# Patient Record
Sex: Female | Born: 1995 | Race: Black or African American | Hispanic: No | Marital: Single | Smoking: Former smoker
Health system: Southern US, Community
[De-identification: ages and names within clinical notes are randomized; demographics above are authoritative.]

## PROBLEM LIST (undated history)

## (undated) ENCOUNTER — Inpatient Hospital Stay (HOSPITAL_COMMUNITY): Payer: Self-pay

## (undated) ENCOUNTER — Emergency Department (HOSPITAL_COMMUNITY): Admission: EM | Payer: Medicaid Other

## (undated) HISTORY — PX: HERNIA REPAIR: SHX51

## (undated) HISTORY — PX: TONSILLECTOMY: SUR1361

## (undated) HISTORY — PX: CHOLECYSTECTOMY: SHX55

---
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
## 2013-02-18 ENCOUNTER — Encounter (HOSPITAL_BASED_OUTPATIENT_CLINIC_OR_DEPARTMENT_OTHER): Payer: Self-pay | Admitting: Emergency Medicine

## 2013-02-18 ENCOUNTER — Emergency Department (HOSPITAL_BASED_OUTPATIENT_CLINIC_OR_DEPARTMENT_OTHER)
Admission: EM | Admit: 2013-02-18 | Discharge: 2013-02-18 | Disposition: A | Discharge status: Home / Self Care | Payer: Medicaid Other | Attending: Emergency Medicine | Admitting: Emergency Medicine

## 2013-02-18 DIAGNOSIS — A499 Bacterial infection, unspecified: Secondary | ICD-10-CM | POA: Insufficient documentation

## 2013-02-18 DIAGNOSIS — Z3202 Encounter for pregnancy test, result negative: Secondary | ICD-10-CM | POA: Insufficient documentation

## 2013-02-18 DIAGNOSIS — M545 Low back pain, unspecified: Secondary | ICD-10-CM | POA: Insufficient documentation

## 2013-02-18 DIAGNOSIS — Z79899 Other long term (current) drug therapy: Secondary | ICD-10-CM | POA: Insufficient documentation

## 2013-02-18 DIAGNOSIS — N760 Acute vaginitis: Secondary | ICD-10-CM | POA: Insufficient documentation

## 2013-02-18 DIAGNOSIS — B9689 Other specified bacterial agents as the cause of diseases classified elsewhere: Secondary | ICD-10-CM | POA: Insufficient documentation

## 2013-02-18 LAB — URINALYSIS, ROUTINE W REFLEX MICROSCOPIC
Bilirubin Urine: NEGATIVE
Glucose, UA: NEGATIVE mg/dL
Hgb urine dipstick: NEGATIVE
Ketones, ur: 15 mg/dL — AB
Protein, ur: NEGATIVE mg/dL

## 2013-02-18 LAB — WET PREP, GENITAL: Trich, Wet Prep: NONE SEEN

## 2013-02-18 LAB — URINE MICROSCOPIC-ADD ON

## 2013-02-18 MED ORDER — NITROFURANTOIN MONOHYD MACRO 100 MG PO CAPS: 100.0000 mg | ORAL_CAPSULE | Freq: Two times a day (BID) | ORAL | Status: DC

## 2013-02-18 MED ORDER — METRONIDAZOLE 500 MG PO TABS: 500.0000 mg | ORAL_TABLET | Freq: Two times a day (BID) | ORAL | Status: DC

## 2013-02-18 NOTE — ED Notes (Signed)
Back pain, dysuria and vaginal d/c.

## 2013-02-18 NOTE — ED Provider Notes (Signed)
CSN: [id number]     Arrival date & time 02/18/13  2116 History   First MD Initiated Contact with Patient 02/18/13 2133     Chief Complaint  Patient presents with  . Dysuria   (Consider location/radiation/quality/duration/timing/severity/associated sxs/prior Treatment) HPI Comments: Patient complains of a one-week history of dysuria, frequency, low back pain and vaginal discharge. Denies any vaginal bleeding, abdominal pain, nausea or vomiting. No fever. Good by mouth intake and urine output.  last menstrual period at the end of October. Denies any leg pain, numbness tingling weakness.   The history is provided by the patient.    History reviewed. No pertinent past medical history. Past Surgical History  Procedure Laterality Date  . Hernia repair     No family history on file. History  Substance Use Topics  . Smoking status: Never Smoker   . Smokeless tobacco: Never Used  . Alcohol Use: Yes   OB History   Grav Para Term Preterm Abortions TAB SAB Ect Mult Living                 Review of Systems  Constitutional: Negative for fever, activity change and appetite change.  Respiratory: Negative for cough, chest tightness and shortness of breath.   Cardiovascular: Negative for chest pain.  Gastrointestinal: Negative for nausea, vomiting and abdominal pain.  Genitourinary: Positive for dysuria, urgency, frequency and vaginal discharge. Negative for vaginal bleeding.  Musculoskeletal: Positive for back pain. Negative for neck pain.  Skin: Negative for rash.  Neurological: Negative for dizziness, weakness and headaches.  A complete 10 system review of systems was obtained and all systems are negative except as noted in the HPI and PMH.    Allergies  Review of patient's allergies indicates no known allergies.  Home Medications   Current Outpatient Rx  Name  Route  Sig  Dispense  Refill  . docusate sodium (COLACE) 100 MG capsule   Oral   Take 1 capsule (100 mg total) by mouth  every 12 (twelve) hours.   60 capsule   0   . metroNIDAZOLE (FLAGYL) 500 MG tablet   Oral   Take 1 tablet (500 mg total) by mouth 2 (two) times daily.   14 tablet   0   . nitrofurantoin, macrocrystal-monohydrate, (MACROBID) 100 MG capsule   Oral   Take 1 capsule (100 mg total) by mouth 2 (two) times daily. X 7 days   14 capsule   0   . nitrofurantoin, macrocrystal-monohydrate, (MACROBID) 100 MG capsule   Oral   Take 1 capsule (100 mg total) by mouth 2 (two) times daily.   10 capsule   0    BP 120/78  Pulse 92  Temp(Src) 98.2 F (36.8 C) (Oral)  Resp 16  Ht 5\' 5"  (1.651 m)  Wt 141 lb (63.957 kg)  BMI 23.46 kg/m2  SpO2 100%  LMP 01/29/2013 Physical Exam  Constitutional: She is oriented to person, place, and time. She appears well-developed and well-nourished. No distress.  HENT:  Head: Normocephalic and atraumatic.  Mouth/Throat: Oropharynx is clear and moist. No oropharyngeal exudate.  Eyes: Conjunctivae and EOM are normal. Pupils are equal, round, and reactive to light.  Neck: Normal range of motion. Neck supple.  Cardiovascular: Normal rate, regular rhythm and normal heart sounds.   No murmur heard. Pulmonary/Chest: Effort normal and breath sounds normal. No respiratory distress.  Abdominal: Soft. There is no tenderness. There is no rebound and no guarding.  Genitourinary:  Normal external genitalia. Friable cervix. No  CMT, no adnexal tenderness  Musculoskeletal: Normal range of motion. She exhibits tenderness. She exhibits no edema.  Paraspinal lumbar tenderness, no CVA tenderness 5/5 strength in bilateral lower extremities. Ankle plantar and dorsiflexion intact. Great toe extension intact bilaterally. +2 DP and PT pulses. +2 patellar reflexes bilaterally. Normal gait.   Neurological: She is alert and oriented to person, place, and time. No cranial nerve deficit. She exhibits normal muscle tone. Coordination normal.  Skin: Skin is warm.    ED Course   Procedures (including critical care time) Labs Review Labs Reviewed  WET PREP, GENITAL - Abnormal; Notable for the following:    Clue Cells Wet Prep HPF POC FEW (*)    WBC, Wet Prep HPF POC FEW (*)    All other components within normal limits  URINALYSIS, ROUTINE W REFLEX MICROSCOPIC - Abnormal; Notable for the following:    APPearance CLOUDY (*)    Specific Gravity, Urine 1.035 (*)    Ketones, ur 15 (*)    Leukocytes, UA SMALL (*)    All other components within normal limits  URINE MICROSCOPIC-ADD ON - Abnormal; Notable for the following:    Squamous Epithelial / LPF FEW (*)    Bacteria, UA FEW (*)    All other components within normal limits  GC/CHLAMYDIA PROBE AMP  URINE CULTURE  PREGNANCY, URINE   Imaging Review No results found.  EKG Interpretation   None       MDM   1. Bacterial vaginosis    Dysuria and frequency, low back pain and foul-smelling vaginal discharge. Vital stable, no distress, no abdominal pain Abdomen soft and nontender. Pelvic exam benign  We'll treat for bacterial vaginosis. Urinalysis is questionable for infection. Culture will be sent. Follow up with PCP.  Glynn Octave, MD 02/18/13 [phone]

## 2013-02-19 LAB — URINE CULTURE

## 2013-02-20 LAB — GC/CHLAMYDIA PROBE AMP
CT Probe RNA: NEGATIVE
GC Probe RNA: NEGATIVE

## 2013-03-09 IMAGING — CR DG CHEST 2V
2 series · 2 of 2 positions shown · non-contrast
Comparison: None.

CLINICAL DATA: Cough.

CHEST - 2 VIEW

[w chest pa]
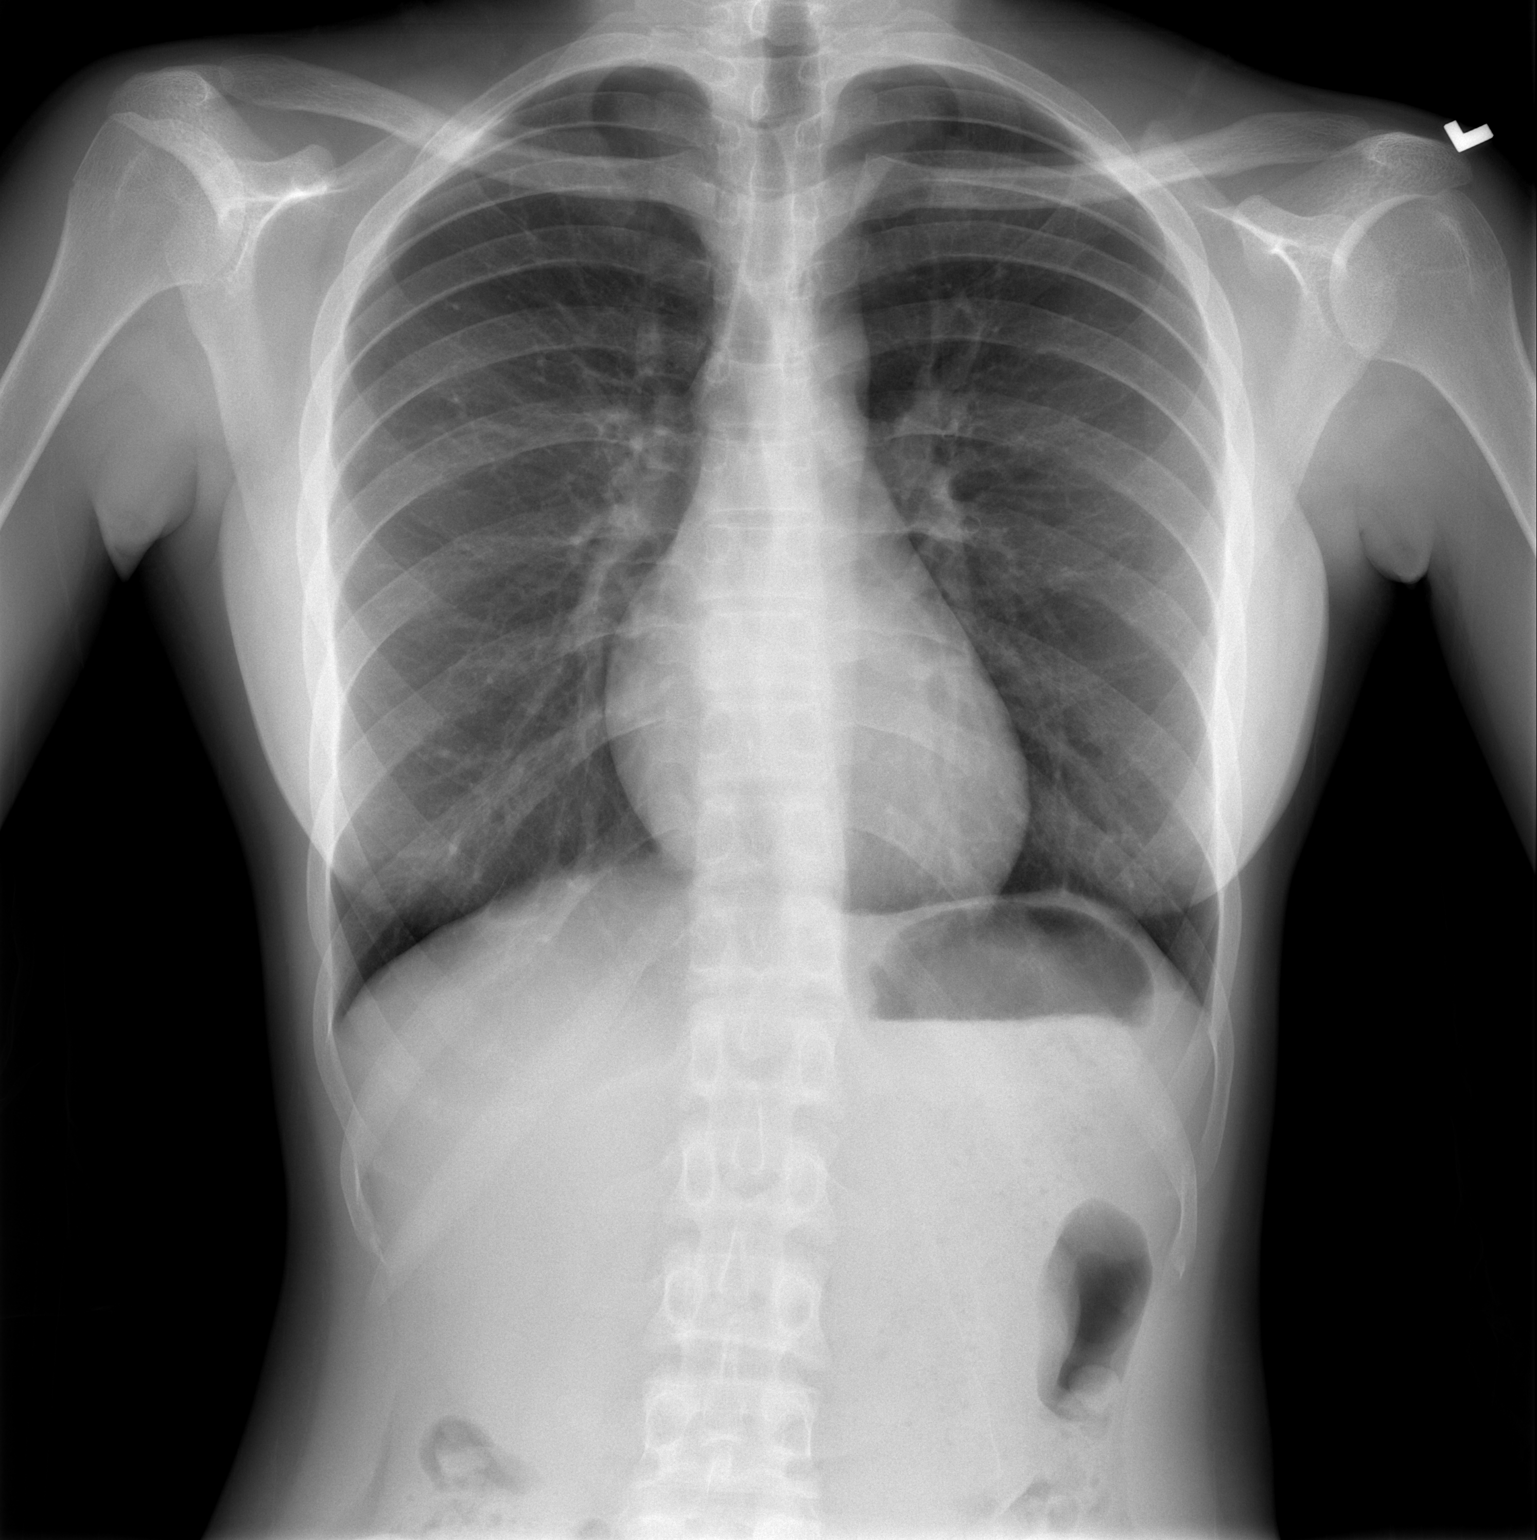

[w chest lat]
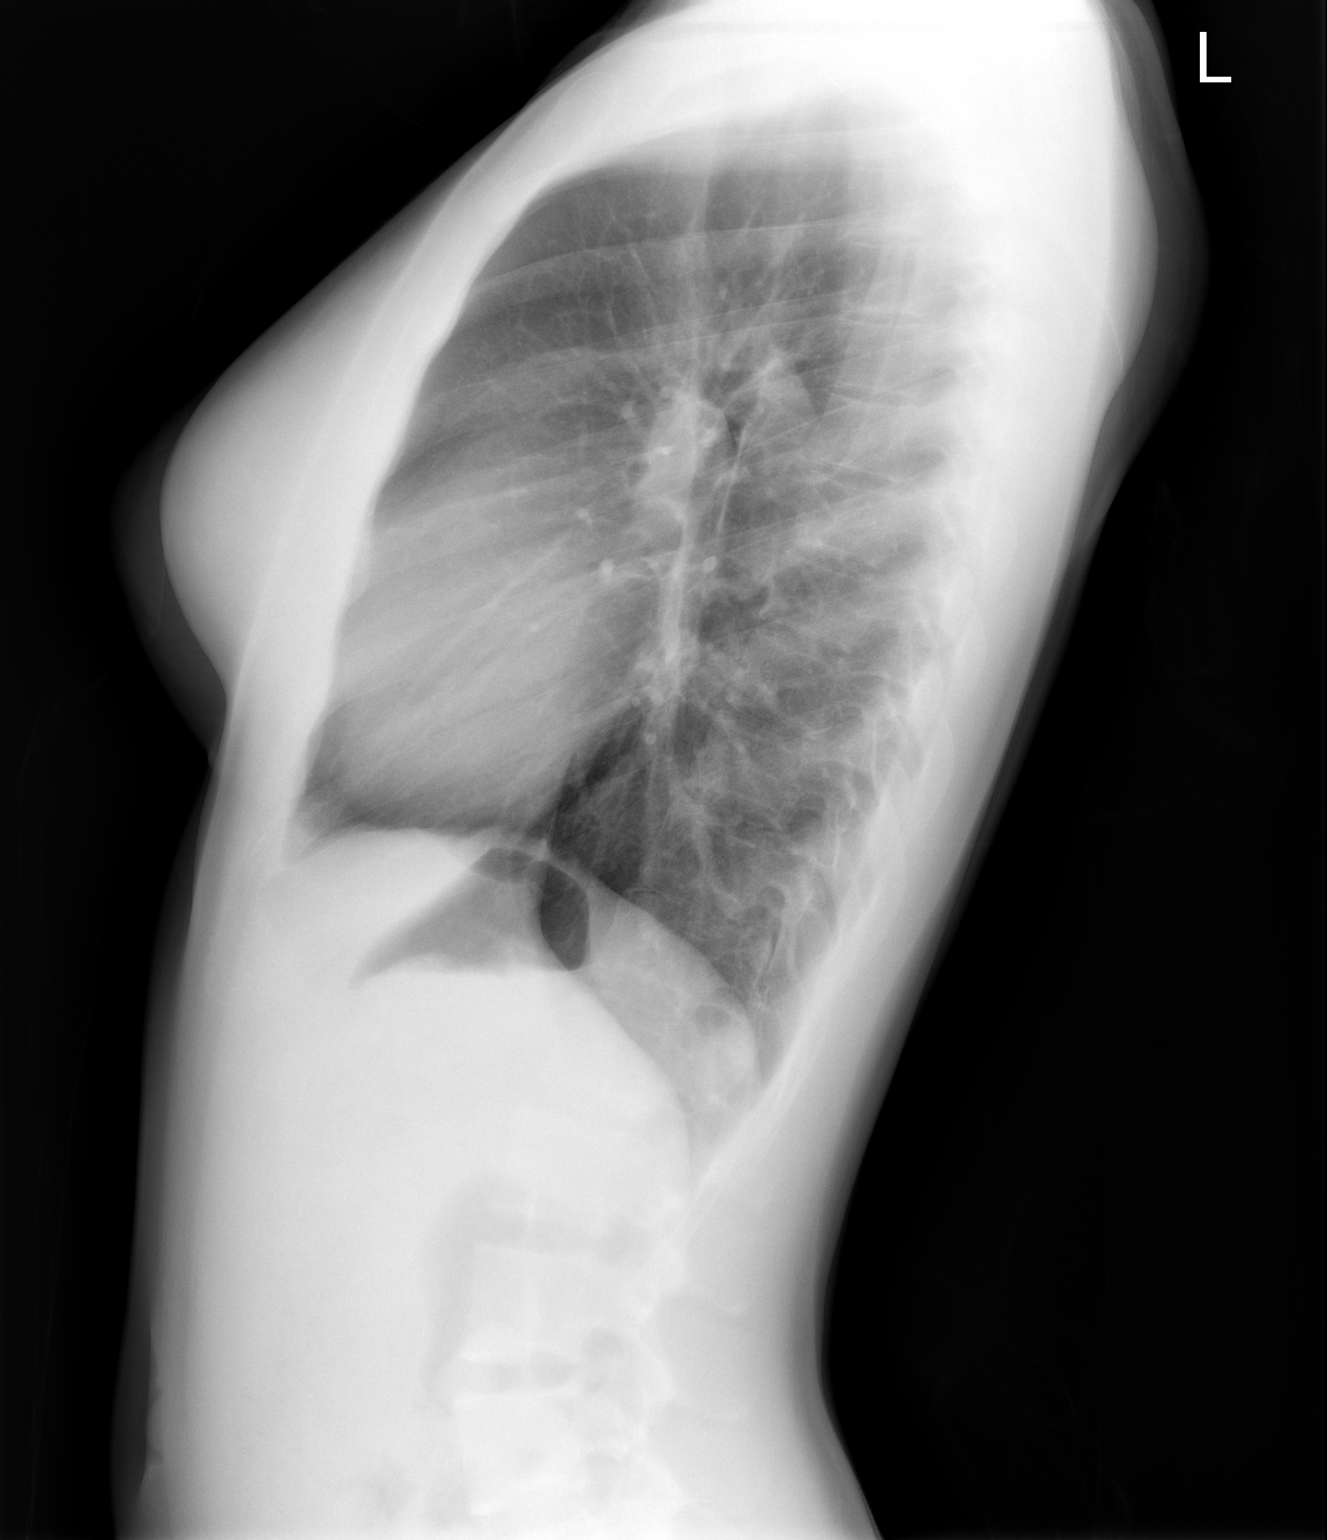

[2 of 2 positions shown; findings below may reference images not displayed]

FINDINGS: The lungs are clear without focal consolidation, edema,
effusion or pneumothorax.  Cardiopericardial silhouette is within
normal limits for size.  Imaged bony structures of the thorax are
intact.
IMPRESSION: Normal exam.

## 2013-06-13 ENCOUNTER — Emergency Department (HOSPITAL_BASED_OUTPATIENT_CLINIC_OR_DEPARTMENT_OTHER)
Admission: EM | Admit: 2013-06-13 | Discharge: 2013-06-14 | Disposition: A | Discharge status: Home / Self Care | Payer: Medicaid Other | Attending: Emergency Medicine | Admitting: Emergency Medicine

## 2013-06-13 ENCOUNTER — Encounter (HOSPITAL_BASED_OUTPATIENT_CLINIC_OR_DEPARTMENT_OTHER): Payer: Self-pay | Admitting: Emergency Medicine

## 2013-06-13 DIAGNOSIS — R0602 Shortness of breath: Secondary | ICD-10-CM | POA: Insufficient documentation

## 2013-06-13 DIAGNOSIS — B9789 Other viral agents as the cause of diseases classified elsewhere: Secondary | ICD-10-CM | POA: Insufficient documentation

## 2013-06-13 DIAGNOSIS — B349 Viral infection, unspecified: Secondary | ICD-10-CM

## 2013-06-13 DIAGNOSIS — Z79899 Other long term (current) drug therapy: Secondary | ICD-10-CM | POA: Insufficient documentation

## 2013-06-13 MED ORDER — CETIRIZINE HCL 10 MG PO TABS: 10.0000 mg | ORAL_TABLET | Freq: Every day | ORAL | Status: DC

## 2013-06-13 NOTE — ED Notes (Signed)
Pt states one week ago she developed headache, shortness of breath-states she thinks her bathroom ceiling has mold on it. Wants to be tested for mold exposure.

## 2013-06-13 NOTE — ED Provider Notes (Signed)
CSN: [id number]     Arrival date & time 06/13/13  2133 History   None    This chart was scribed for Ziva Nunziata Smitty Cords, MD by Arlan Organ, ED Scribe. This patient was seen in room MH11/MH11 and the patient's care was started 11:30 PM.   Chief Complaint  Patient presents with  . Headache  . Shortness of Breath   Patient is a 18 y.o. female presenting with URI. The history is provided by the patient. No language interpreter was used.  URI Presenting symptoms: cough, ear pain and rhinorrhea   Presenting symptoms: no fever   Severity:  Mild Onset quality:  Gradual Timing:  Constant Progression:  Unchanged Chronicity:  New Relieved by:  Nothing Worsened by:  Nothing tried Ineffective treatments:  None tried Associated symptoms: headaches   Associated symptoms: no swollen glands   Risk factors: not elderly     HPI Comments: NAVINA WOHLERS is a 18 y.o. female who presents to the Emergency Department complaining of a HA x 1 week that is progressively worsening. She also reports mild otalgia, and cough.  Requesting to be tested for exposure today. She denies trying anything OTC for her symptoms at this time. Pt has no pertinent medical history, and no other complaints this visit.    History reviewed. No pertinent past medical history. Past Surgical History  Procedure Laterality Date  . Hernia repair     History reviewed. No pertinent family history. History  Substance Use Topics  . Smoking status: Never Smoker   . Smokeless tobacco: Never Used  . Alcohol Use: Yes     Comment: occasionally    OB History   Grav Para Term Preterm Abortions TAB SAB Ect Mult Living                 Review of Systems  Constitutional: Negative for fever and chills.  HENT: Positive for ear pain and rhinorrhea. Negative for drooling.   Eyes: Negative for redness.  Respiratory: Positive for cough. Negative for shortness of breath.   Cardiovascular: Negative for chest pain, palpitations and leg  swelling.  Gastrointestinal: Positive for nausea.  Skin: Negative for rash.  Neurological: Positive for headaches.  Psychiatric/Behavioral: Negative for confusion.  All other systems reviewed and are negative.      Allergies  Review of patient's allergies indicates no known allergies.  Home Medications   Current Outpatient Rx  Name  Route  Sig  Dispense  Refill  . docusate sodium (COLACE) 100 MG capsule   Oral   Take 1 capsule (100 mg total) by mouth every 12 (twelve) hours.   60 capsule   0   . Multiple Vitamins-Minerals (MULTIVITAMIN WITH MINERALS) tablet   Oral   Take 1 tablet by mouth daily.         . metroNIDAZOLE (FLAGYL) 500 MG tablet   Oral   Take 1 tablet (500 mg total) by mouth 2 (two) times daily.   14 tablet   0   . nitrofurantoin, macrocrystal-monohydrate, (MACROBID) 100 MG capsule   Oral   Take 1 capsule (100 mg total) by mouth 2 (two) times daily. X 7 days   14 capsule   0   . nitrofurantoin, macrocrystal-monohydrate, (MACROBID) 100 MG capsule   Oral   Take 1 capsule (100 mg total) by mouth 2 (two) times daily.   10 capsule   0    Triage Vitals: BP 129/88  Pulse 77  Temp(Src) 98.2 F (36.8 C) (Oral)  Resp 20  Ht 5\' 6"  (1.676 m)  Wt 146 lb (66.225 kg)  BMI 23.58 kg/m2  SpO2 100%  LMP 05/28/2013   Physical Exam  Nursing note and vitals reviewed. Constitutional: She is oriented to person, place, and time. She appears well-developed and well-nourished. No distress.  HENT:  Head: Normocephalic and atraumatic.  Right Ear: External ear normal. Tympanic membrane is retracted.  Left Ear: External ear normal. Tympanic membrane is retracted.  Mouth/Throat: Oropharynx is clear and moist. No oropharyngeal exudate.  Bogy nasal turbinates  No strider No lip swelling No meningeal sights No tongue swelling Postnasal drip  Eyes: Conjunctivae and EOM are normal. Pupils are equal, round, and reactive to light.  Neck: Normal range of motion. Neck  supple.  Cardiovascular: Normal rate, regular rhythm, normal heart sounds and intact distal pulses.   Pulmonary/Chest: Effort normal and breath sounds normal. No stridor. No respiratory distress. She has no wheezes. She has no rales.  Abdominal: Soft. Bowel sounds are normal. She exhibits no distension. There is no tenderness. There is no rebound and no guarding.  Lymphadenopathy:    She has no cervical adenopathy.  Neurological: She is alert and oriented to person, place, and time.  Skin: Skin is warm and dry.  Psychiatric: She has a normal mood and affect. Judgment normal.    ED Course  Procedures (including critical care time)  DIAGNOSTIC STUDIES: Oxygen Saturation is 100% on RA, Normal by my interpretation.    COORDINATION OF CARE: 11:37 PM- Will give Zyrtec. Discussed treatment plan with pt at bedside and pt agreed to plan.     Labs Review Labs Reviewed - No data to display Imaging Review No results found.   EKG Interpretation None      MDM   Final diagnoses:  None    Viral syndrome will treat conservatively  I personally performed the services described in this documentation, which was scribed in my presence. The recorded information has been reviewed and is accurate.    Jasmine AweApril K Edell Mesenbrink-Rasch, MD 06/14/13 [phone]

## 2013-06-14 ENCOUNTER — Encounter (HOSPITAL_BASED_OUTPATIENT_CLINIC_OR_DEPARTMENT_OTHER): Payer: Self-pay | Admitting: Emergency Medicine

## 2013-09-19 ENCOUNTER — Emergency Department (HOSPITAL_BASED_OUTPATIENT_CLINIC_OR_DEPARTMENT_OTHER)
Admission: EM | Admit: 2013-09-19 | Discharge: 2013-09-19 | Discharge status: Against Medical Advice | Payer: Medicaid Other | Attending: Emergency Medicine | Admitting: Emergency Medicine

## 2013-09-19 ENCOUNTER — Encounter (HOSPITAL_BASED_OUTPATIENT_CLINIC_OR_DEPARTMENT_OTHER): Payer: Self-pay | Admitting: Emergency Medicine

## 2013-09-19 DIAGNOSIS — N390 Urinary tract infection, site not specified: Secondary | ICD-10-CM | POA: Insufficient documentation

## 2013-09-19 LAB — URINALYSIS, ROUTINE W REFLEX MICROSCOPIC
BILIRUBIN URINE: NEGATIVE
GLUCOSE, UA: NEGATIVE mg/dL
HGB URINE DIPSTICK: NEGATIVE
KETONES UR: NEGATIVE mg/dL
LEUKOCYTES UA: NEGATIVE
Nitrite: NEGATIVE
PH: 5.5 (ref 5.0–8.0)
Protein, ur: NEGATIVE mg/dL
Specific Gravity, Urine: 1.03 (ref 1.005–1.030)
Urobilinogen, UA: 0.2 mg/dL (ref 0.0–1.0)

## 2013-09-19 LAB — PREGNANCY, URINE: Preg Test, Ur: NEGATIVE

## 2013-09-19 NOTE — ED Notes (Signed)
Registration reports that pt left the ER lobby.

## 2013-09-19 NOTE — ED Notes (Signed)
Pt reports dysuria, frequency and urgency x 3 days. Denies n/v

## 2013-11-07 ENCOUNTER — Encounter (HOSPITAL_BASED_OUTPATIENT_CLINIC_OR_DEPARTMENT_OTHER): Payer: Self-pay | Admitting: Emergency Medicine

## 2013-11-07 ENCOUNTER — Emergency Department (HOSPITAL_BASED_OUTPATIENT_CLINIC_OR_DEPARTMENT_OTHER)
Admission: EM | Admit: 2013-11-07 | Discharge: 2013-11-07 | Disposition: A | Discharge status: Home / Self Care | Payer: Medicaid Other | Attending: Emergency Medicine | Admitting: Emergency Medicine

## 2013-11-07 DIAGNOSIS — R112 Nausea with vomiting, unspecified: Secondary | ICD-10-CM | POA: Insufficient documentation

## 2013-11-07 DIAGNOSIS — R109 Unspecified abdominal pain: Secondary | ICD-10-CM | POA: Insufficient documentation

## 2013-11-07 DIAGNOSIS — Z9889 Other specified postprocedural states: Secondary | ICD-10-CM | POA: Insufficient documentation

## 2013-11-07 DIAGNOSIS — Z79899 Other long term (current) drug therapy: Secondary | ICD-10-CM | POA: Insufficient documentation

## 2013-11-07 DIAGNOSIS — Z3202 Encounter for pregnancy test, result negative: Secondary | ICD-10-CM | POA: Insufficient documentation

## 2013-11-07 LAB — URINALYSIS, ROUTINE W REFLEX MICROSCOPIC
Bilirubin Urine: NEGATIVE
GLUCOSE, UA: NEGATIVE mg/dL
HGB URINE DIPSTICK: NEGATIVE
Ketones, ur: 15 mg/dL — AB
Leukocytes, UA: NEGATIVE
Nitrite: NEGATIVE
Protein, ur: 30 mg/dL — AB
SPECIFIC GRAVITY, URINE: 1.03 (ref 1.005–1.030)
UROBILINOGEN UA: 1 mg/dL (ref 0.0–1.0)
pH: 6 (ref 5.0–8.0)

## 2013-11-07 LAB — URINE MICROSCOPIC-ADD ON

## 2013-11-07 LAB — PREGNANCY, URINE: PREG TEST UR: NEGATIVE

## 2013-11-07 MED ORDER — ONDANSETRON 4 MG PO TBDP
4.0000 mg | ORAL_TABLET | Freq: Once | ORAL | Status: AC
  Administered 2013-11-07: 4 mg via ORAL
  Filled 2013-11-07: qty 1

## 2013-11-07 MED ORDER — ONDANSETRON 4 MG PO TBDP: 4.0000 mg | ORAL_TABLET | Freq: Three times a day (TID) | ORAL | Status: DC | PRN

## 2013-11-07 NOTE — Discharge Instructions (Signed)

## 2013-11-07 NOTE — ED Provider Notes (Signed)
CSN: [id number]     Arrival date & time 11/07/13  2017 History   First MD Initiated Contact with Patient 11/07/13 2035     Chief Complaint  Patient presents with  . Emesis     (Consider location/radiation/quality/duration/timing/severity/associated sxs/prior Treatment) Patient is a 18 y.o. female presenting with vomiting. The history is provided by the patient.  Emesis Severity:  Mild Duration:  10 hours Timing:  Sporadic Number of daily episodes:  3 Quality:  Undigested food How soon after eating does vomiting occur:  10 minutes Progression:  Unchanged Associated symptoms: abdominal pain    Traci Carney is a 18 y.o. female who presents to the ED with nausea and vomiting that stated this morning about 10 o'clock after she ate a biscuit and fried potatoes. After eating at 5 pm crackers and water and then again about 5:30. She has not tried to eat anything since then. She had epigastric pain but that has resolved.   History reviewed. No pertinent past medical history. Past Surgical History  Procedure Laterality Date  . Hernia repair     History reviewed. No pertinent family history. History  Substance Use Topics  . Smoking status: Never Smoker   . Smokeless tobacco: Never Used  . Alcohol Use: Yes     Comment: occasionally    OB History   Grav Para Term Preterm Abortions TAB SAB Ect Mult Living                 Review of Systems  Gastrointestinal: Positive for nausea, vomiting and abdominal pain.  all other systems negative     Allergies  Review of patient's allergies indicates no known allergies.  Home Medications   Prior to Admission medications   Medication Sig Start Date End Date Taking? Authorizing Provider  cetirizine (ZYRTEC ALLERGY) 10 MG tablet Take 1 tablet (10 mg total) by mouth daily. 06/13/13   April K Palumbo-Rasch, MD  docusate sodium (COLACE) 100 MG capsule Take 1 capsule (100 mg total) by mouth every 12 (twelve) hours. 10/29/12   April K  Palumbo-Rasch, MD  metroNIDAZOLE (FLAGYL) 500 MG tablet Take 1 tablet (500 mg total) by mouth 2 (two) times daily. 02/18/13   Glynn OctaveStephen Rancour, MD  Multiple Vitamins-Minerals (MULTIVITAMIN WITH MINERALS) tablet Take 1 tablet by mouth daily.    Historical Provider, MD  nitrofurantoin, macrocrystal-monohydrate, (MACROBID) 100 MG capsule Take 1 capsule (100 mg total) by mouth 2 (two) times daily. X 7 days 10/29/12   April K Palumbo-Rasch, MD  nitrofurantoin, macrocrystal-monohydrate, (MACROBID) 100 MG capsule Take 1 capsule (100 mg total) by mouth 2 (two) times daily. 02/18/13   Glynn OctaveStephen Rancour, MD  ondansetron (ZOFRAN ODT) 4 MG disintegrating tablet Take 1 tablet (4 mg total) by mouth every 8 (eight) hours as needed for nausea or vomiting. 11/07/13   Hope Orlene OchM Neese, NP   BP 131/76  Pulse 79  Temp(Src) 98.7 F (37.1 C) (Oral)  Resp 20  Ht 5\' 6"  (1.676 m)  Wt 132 lb (59.875 kg)  BMI 21.32 kg/m2  SpO2 100%  LMP 10/10/2013 Physical Exam  Nursing note and vitals reviewed. Constitutional: She is oriented to person, place, and time. She appears well-developed and well-nourished.  HENT:  Head: Atraumatic.  Eyes: EOM are normal.  Neck: Neck supple.  Cardiovascular: Normal rate.   Pulmonary/Chest: Effort normal.  Abdominal: Soft. Bowel sounds are normal. There is no tenderness.  Unable to reproduce the epigastric pain the patient had earlier today.   Musculoskeletal: Normal range  of motion.  Neurological: She is alert and oriented to person, place, and time. No cranial nerve deficit.  Skin: Skin is warm and dry.  Psychiatric: She has a normal mood and affect. Her behavior is normal.    ED Course  Procedures (including critical care time) Labs Review Zofran ODT, PO fluids Results for orders placed during the hospital encounter of 11/07/13 (from the past 24 hour(s))  URINALYSIS, ROUTINE W REFLEX MICROSCOPIC     Status: Abnormal   Collection Time    11/07/13  8:33 PM      Result Value Ref Range    Color, Urine YELLOW  YELLOW   APPearance CLOUDY (*) CLEAR   Specific Gravity, Urine 1.030  1.005 - 1.030   pH 6.0  5.0 - 8.0   Glucose, UA NEGATIVE  NEGATIVE mg/dL   Hgb urine dipstick NEGATIVE  NEGATIVE   Bilirubin Urine NEGATIVE  NEGATIVE   Ketones, ur 15 (*) NEGATIVE mg/dL   Protein, ur 30 (*) NEGATIVE mg/dL   Urobilinogen, UA 1.0  0.0 - 1.0 mg/dL   Nitrite NEGATIVE  NEGATIVE   Leukocytes, UA NEGATIVE  NEGATIVE  PREGNANCY, URINE     Status: None   Collection Time    11/07/13  8:33 PM      Result Value Ref Range   Preg Test, Ur NEGATIVE  NEGATIVE  URINE MICROSCOPIC-ADD ON     Status: Abnormal   Collection Time    11/07/13  8:33 PM      Result Value Ref Range   Squamous Epithelial / LPF MANY (*) RARE   WBC, UA 3-6  <3 WBC/hpf   Bacteria, UA MANY (*) RARE   Urine-Other MUCOUS PRESENT     After Zofran the patient's symptoms have resolved. She is taking PO fluids without nausea or pain. MDM   18 y.o. female with episode of nausea and vomiting earlier today that has resolved. Will give Rx for Zofran and she will follow up with her PCP or return for worsening symptoms. Discussed with the patient and all questioned fully answered.   Janne Napoleon, [place] 11/08/13 [phone]

## 2013-11-07 NOTE — ED Notes (Signed)
Pt discharged to home with family. NAD.  

## 2013-11-07 NOTE — ED Notes (Signed)
Vomited x3 today.

## 2013-11-10 NOTE — ED Provider Notes (Signed)
Medical screening examination/treatment/procedure(s) were performed by non-physician practitioner and as supervising physician I was immediately available for consultation/collaboration.   EKG Interpretation None        Audree CamelScott T Iviona Hole, MD 11/10/13 [phone]

## 2014-07-29 ENCOUNTER — Encounter (HOSPITAL_BASED_OUTPATIENT_CLINIC_OR_DEPARTMENT_OTHER): Payer: Self-pay

## 2014-07-29 ENCOUNTER — Emergency Department (HOSPITAL_BASED_OUTPATIENT_CLINIC_OR_DEPARTMENT_OTHER)
Admission: EM | Admit: 2014-07-29 | Discharge: 2014-07-29 | Disposition: A | Discharge status: Home / Self Care | Payer: Medicaid Other | Attending: Emergency Medicine | Admitting: Emergency Medicine

## 2014-07-29 DIAGNOSIS — J069 Acute upper respiratory infection, unspecified: Secondary | ICD-10-CM | POA: Insufficient documentation

## 2014-07-29 MED ORDER — DEXTROMETHORPHAN-GUAIFENESIN 10-100 MG/5ML PO LIQD: 10.0000 mL | ORAL | Status: DC | PRN

## 2014-07-29 NOTE — Discharge Instructions (Signed)
As discussed, your evaluation today has been largely reassuring.  But, it is important that you monitor your condition carefully, and do not hesitate to return to the ED if you develop new, or concerning changes in your condition. ° °Otherwise, please follow-up with your physician for appropriate ongoing care. ° ° °Upper Respiratory Infection, Adult °An upper respiratory infection (URI) is also sometimes known as the common cold. The upper respiratory tract includes the nose, sinuses, throat, trachea, and bronchi. Bronchi are the airways leading to the lungs. Most people improve within 1 week, but symptoms can last up to 2 weeks. A residual cough may last even longer.  °CAUSES °Many different viruses can infect the tissues lining the upper respiratory tract. The tissues become irritated and inflamed and often become very moist. Mucus production is also common. A cold is contagious. You can easily spread the virus to others by oral contact. This includes kissing, sharing a glass, coughing, or sneezing. Touching your mouth or nose and then touching a surface, which is then touched by another person, can also spread the virus. °SYMPTOMS  °Symptoms typically develop 1 to 3 days after you come in contact with a cold virus. Symptoms vary from person to person. They may include: °· Runny nose. °· Sneezing. °· Nasal congestion. °· Sinus irritation. °· Sore throat. °· Loss of voice (laryngitis). °· Cough. °· Fatigue. °· Muscle aches. °· Loss of appetite. °· Headache. °· Low-grade fever. °DIAGNOSIS  °You might diagnose your own cold based on familiar symptoms, since most people get a cold 2 to 3 times a year. Your caregiver can confirm this based on your exam. Most importantly, your caregiver can check that your symptoms are not due to another disease such as strep throat, sinusitis, pneumonia, asthma, or epiglottitis. Blood tests, throat tests, and X-rays are not necessary to diagnose a common cold, but they may sometimes be  helpful in excluding other more serious diseases. Your caregiver will decide if any further tests are required. °RISKS AND COMPLICATIONS  °You may be at risk for a more severe case of the common cold if you smoke cigarettes, have chronic heart disease (such as heart failure) or lung disease (such as asthma), or if you have a weakened immune system. The very young and very old are also at risk for more serious infections. Bacterial sinusitis, middle ear infections, and bacterial pneumonia can complicate the common cold. The common cold can worsen asthma and chronic obstructive pulmonary disease (COPD). Sometimes, these complications can require emergency medical care and may be life-threatening. °PREVENTION  °The best way to protect against getting a cold is to practice good hygiene. Avoid oral or hand contact with people with cold symptoms. Wash your hands often if contact occurs. There is no clear evidence that vitamin C, vitamin E, echinacea, or exercise reduces the chance of developing a cold. However, it is always recommended to get plenty of rest and practice good nutrition. °TREATMENT  °Treatment is directed at relieving symptoms. There is no cure. Antibiotics are not effective, because the infection is caused by a virus, not by bacteria. Treatment may include: °· Increased fluid intake. Sports drinks offer valuable electrolytes, sugars, and fluids. °· Breathing heated mist or steam (vaporizer or shower). °· Eating chicken soup or other clear broths, and maintaining good nutrition. °· Getting plenty of rest. °· Using gargles or lozenges for comfort. °· Controlling fevers with ibuprofen or acetaminophen as directed by your caregiver. °· Increasing usage of your inhaler if you have   asthma. °Zinc gel and zinc lozenges, taken in the first 24 hours of the common cold, can shorten the duration and lessen the severity of symptoms. Pain medicines may help with fever, muscle aches, and throat pain. A variety of  non-prescription medicines are available to treat congestion and runny nose. Your caregiver can make recommendations and may suggest nasal or lung inhalers for other symptoms.  °HOME CARE INSTRUCTIONS  °· Only take over-the-counter or prescription medicines for pain, discomfort, or fever as directed by your caregiver. °· Use a warm mist humidifier or inhale steam from a shower to increase air moisture. This may keep secretions moist and make it easier to breathe. °· Drink enough water and fluids to keep your urine clear or pale yellow. °· Rest as needed. °· Return to work when your temperature has returned to normal or as your caregiver advises. You may need to stay home longer to avoid infecting others. You can also use a face mask and careful hand washing to prevent spread of the virus. °SEEK MEDICAL CARE IF:  °· After the first few days, you feel you are getting worse rather than better. °· You need your caregiver's advice about medicines to control symptoms. °· You develop chills, worsening shortness of breath, or brown or red sputum. These may be signs of pneumonia. °· You develop yellow or brown nasal discharge or pain in the face, especially when you bend forward. These may be signs of sinusitis. °· You develop a fever, swollen neck glands, pain with swallowing, or white areas in the back of your throat. These may be signs of strep throat. °SEEK IMMEDIATE MEDICAL CARE IF:  °· You have a fever. °· You develop severe or persistent headache, ear pain, sinus pain, or chest pain. °· You develop wheezing, a prolonged cough, cough up blood, or have a change in your usual mucus (if you have chronic lung disease). °· You develop sore muscles or a stiff neck. °Document Released: 09/11/2000 Document Revised: 06/10/2011 Document Reviewed: 06/23/2013 °[employer]® Patient Information ©2015 [employer]. This information is not intended to replace advice given to you by your health care provider. Make sure you discuss any  questions you have with your health care provider. ° °

## 2014-07-29 NOTE — ED Provider Notes (Signed)
CSN: [id number]     Arrival date & time 07/29/14  1028 History   First MD Initiated Contact with Patient 07/29/14 1037     Chief Complaint  Patient presents with  . URI     (Consider location/radiation/quality/duration/timing/severity/associated sxs/prior Treatment) HPI Patient presents with concern of one week of URI like symptoms. Initially the patient had mild sore throat, developed cough, generalized discomfort, nausea. Symptoms have progressed, though the sore throat has resolved. No recent fever, chills, vomiting, diarrhea. Patient was well prior to the onset of symptoms. She has relief with Delsym. Patient is here, at the request of work Acupuncturistcolleagues.  History reviewed. No pertinent past medical history. Past Surgical History  Procedure Laterality Date  . Hernia repair     No family history on file. History  Substance Use Topics  . Smoking status: Never Smoker   . Smokeless tobacco: Never Used  . Alcohol Use: Yes     Comment: occasionally    OB History    No data available     Review of Systems  Constitutional:       Per HPI, otherwise negative  HENT:       Per HPI, otherwise negative  Respiratory:       Per HPI, otherwise negative  Cardiovascular:       Per HPI, otherwise negative  Gastrointestinal: Negative for vomiting.  Endocrine:       Negative aside from HPI  Genitourinary:       Neg aside from HPI   Musculoskeletal:       Per HPI, otherwise negative  Skin: Negative.   Neurological: Negative for syncope.      Allergies  Review of patient's allergies indicates no known allergies.  Home Medications   Prior to Admission medications   Medication Sig Start Date End Date Taking? Authorizing Provider  dextromethorphan-guaiFENesin (TUSSIN DM) 10-100 MG/5ML liquid Take 10 mLs by mouth every 4 (four) hours as needed for cough. 07/29/14   Gerhard Munchobert Bristal Steffy, MD   BP 117/78 mmHg  Pulse 64  Temp(Src) 98.6 F (37 C) (Oral)  Resp 16  Ht 5\' 5"  (1.651 m)   Wt 120 lb (54.432 kg)  BMI 19.97 kg/m2  SpO2 100%  LMP 07/06/2014 (Exact Date) Physical Exam  Constitutional: She is oriented to person, place, and time. She appears well-developed and well-nourished. No distress.  HENT:  Head: Normocephalic and atraumatic.  Eyes: Conjunctivae and EOM are normal.  Cardiovascular: Normal rate and regular rhythm.   Pulmonary/Chest: Effort normal and breath sounds normal. No stridor. No respiratory distress. She has no wheezes. She has no rales. She exhibits no tenderness.  Abdominal: She exhibits no distension.  Musculoskeletal: She exhibits no edema.  Neurological: She is alert and oriented to person, place, and time. No cranial nerve deficit.  Skin: Skin is warm and dry.  Psychiatric: She has a normal mood and affect.  Nursing note and vitals reviewed.   ED Course  Procedures (including critical care time)   MDM   Final diagnoses:  URI (upper respiratory infection)   well-appearing female presents with one week of URI like symptoms. Patient is hemodynamically stable with no hypoxia, tachypnea suggestive of pneumonia, PE. No evidence for sepsis or bacteremia. Patient has had some relief with OTC medication, received additional symptom control, was discharged in stable condition with primary care follow-up.   Gerhard Munchobert Aquarius Latouche, MD 07/29/14 1239

## 2014-07-29 NOTE — ED Notes (Signed)
Developed URI symptoms 1 week ago and now has productive cough.  Taking Delsym and TheraFlu without relief.

## 2015-09-14 ENCOUNTER — Emergency Department (HOSPITAL_BASED_OUTPATIENT_CLINIC_OR_DEPARTMENT_OTHER)
Admission: EM | Admit: 2015-09-14 | Discharge: 2015-09-14 | Disposition: A | Discharge status: Home / Self Care | Payer: Medicaid Other | Attending: Emergency Medicine | Admitting: Emergency Medicine

## 2015-09-14 ENCOUNTER — Encounter (HOSPITAL_BASED_OUTPATIENT_CLINIC_OR_DEPARTMENT_OTHER): Payer: Self-pay | Admitting: *Deleted

## 2015-09-14 DIAGNOSIS — R110 Nausea: Secondary | ICD-10-CM | POA: Insufficient documentation

## 2015-09-14 DIAGNOSIS — R519 Headache, unspecified: Secondary | ICD-10-CM

## 2015-09-14 DIAGNOSIS — R51 Headache: Secondary | ICD-10-CM | POA: Insufficient documentation

## 2015-09-14 LAB — CBC WITH DIFFERENTIAL/PLATELET
BASOS ABS: 0 10*3/uL (ref 0.0–0.1)
Basophils Relative: 0 %
Eosinophils Absolute: 0.1 10*3/uL (ref 0.0–0.7)
Eosinophils Relative: 1 %
HCT: 38.4 % (ref 36.0–46.0)
Hemoglobin: 12.9 g/dL (ref 12.0–15.0)
LYMPHS ABS: 2.4 10*3/uL (ref 0.7–4.0)
LYMPHS PCT: 52 %
MCH: 27 pg (ref 26.0–34.0)
MCHC: 33.6 g/dL (ref 30.0–36.0)
MCV: 80.3 fL (ref 78.0–100.0)
Monocytes Absolute: 0.3 10*3/uL (ref 0.1–1.0)
Monocytes Relative: 7 %
NEUTROS PCT: 40 %
Neutro Abs: 1.9 10*3/uL (ref 1.7–7.7)
PLATELETS: 248 10*3/uL (ref 150–400)
RBC: 4.78 MIL/uL (ref 3.87–5.11)
RDW: 13.8 % (ref 11.5–15.5)
WBC: 4.7 10*3/uL (ref 4.0–10.5)

## 2015-09-14 LAB — BASIC METABOLIC PANEL
ANION GAP: 5 (ref 5–15)
BUN: 10 mg/dL (ref 6–20)
CALCIUM: 9.3 mg/dL (ref 8.9–10.3)
CO2: 25 mmol/L (ref 22–32)
Chloride: 106 mmol/L (ref 101–111)
Creatinine, Ser: 0.78 mg/dL (ref 0.44–1.00)
GFR calc Af Amer: >60 mL/min (ref >60)
Glucose, Bld: 85 mg/dL (ref 65–99)
POTASSIUM: 4 mmol/L (ref 3.5–5.1)
SODIUM: 136 mmol/L (ref 135–145)

## 2015-09-14 LAB — URINALYSIS, ROUTINE W REFLEX MICROSCOPIC
Bilirubin Urine: NEGATIVE
Glucose, UA: NEGATIVE mg/dL
Hgb urine dipstick: NEGATIVE
Ketones, ur: NEGATIVE mg/dL
NITRITE: NEGATIVE
Protein, ur: NEGATIVE mg/dL
SPECIFIC GRAVITY, URINE: 1.022 (ref 1.005–1.030)
pH: 5.5 (ref 5.0–8.0)

## 2015-09-14 LAB — URINE MICROSCOPIC-ADD ON

## 2015-09-14 LAB — PREGNANCY, URINE: PREG TEST UR: NEGATIVE

## 2015-09-14 NOTE — ED Notes (Signed)
Headache for a week. States she feels faint and nauseated.

## 2015-09-14 NOTE — ED Notes (Signed)
MD at bedside. 

## 2015-09-14 NOTE — ED Provider Notes (Signed)
CSN: [id number]     Arrival date & time 09/14/15  1136 History   First MD Initiated Contact with Patient 09/14/15 1146     Chief Complaint  Patient presents with  . Headache     (Consider location/radiation/quality/duration/timing/severity/associated sxs/prior Treatment) Patient is a 20 y.o. female presenting with headaches. The history is provided by the patient.  Headache Pain location:  Frontal Quality:  Dull Radiates to:  Does not radiate Severity currently:  0/10 Severity at highest:  8/10 Onset quality:  Gradual Duration:  1 week Timing:  Intermittent Progression:  Resolved Chronicity:  New Similar to prior headaches: yes   Context comment:  Better with eating Relieved by:  None tried Worsened by:  Nothing Ineffective treatments:  None tried Associated symptoms: no abdominal pain, no back pain, no blurred vision, no congestion, no cough, no diarrhea, no dizziness, no eye pain, no fever, no loss of balance, no neck stiffness, no paresthesias, no photophobia, no URI and no visual change   Risk factors: no family hx of SAH   Risk factors comment:  Familly hx of htn, dm   History reviewed. No pertinent past medical history. Past Surgical History  Procedure Laterality Date  . Hernia repair     No family history on file. Social History  Substance Use Topics  . Smoking status: Never Smoker   . Smokeless tobacco: Never Used  . Alcohol Use: Yes     Comment: occasionally    OB History    No data available     Review of Systems  Constitutional: Negative for fever.  HENT: Negative for congestion.   Eyes: Negative for blurred vision, photophobia and pain.  Respiratory: Negative for cough.   Gastrointestinal: Negative for abdominal pain and diarrhea.  Musculoskeletal: Negative for back pain and neck stiffness.  Neurological: Positive for headaches. Negative for dizziness, paresthesias and loss of balance.  All other systems reviewed and are  negative.     Allergies  Review of patient's allergies indicates no known allergies.  Home Medications   Prior to Admission medications   Medication Sig Start Date End Date Taking? Authorizing Provider  dextromethorphan-guaiFENesin (TUSSIN DM) 10-100 MG/5ML liquid Take 10 mLs by mouth every 4 (four) hours as needed for cough. 07/29/14   Gerhard Munchobert Lockwood, MD   BP 115/82 mmHg  Pulse 73  Temp(Src) 98.8 F (37.1 C) (Oral)  Resp 18  Ht 5\' 6"  (1.676 m)  Wt 147 lb (66.679 kg)  BMI 23.74 kg/m2  SpO2 100%  LMP 09/03/2015 Physical Exam  Constitutional: She is oriented to person, place, and time. She appears well-developed and well-nourished. No distress.  HENT:  Head: Normocephalic and atraumatic.  Right Ear: Tympanic membrane normal.  Left Ear: Tympanic membrane normal.  Nose: No mucosal edema or rhinorrhea.  Mouth/Throat: Oropharynx is clear and moist.  Eyes: Conjunctivae and EOM are normal. Pupils are equal, round, and reactive to light.  Neck: Normal range of motion. Neck supple.  Cardiovascular: Normal rate, regular rhythm and intact distal pulses.   No murmur heard. Pulmonary/Chest: Effort normal and breath sounds normal. No respiratory distress. She has no wheezes. She has no rales.  Abdominal: Soft. She exhibits no distension. There is no tenderness. There is no rebound and no guarding.  Musculoskeletal: Normal range of motion. She exhibits no edema or tenderness.  Neurological: She is alert and oriented to person, place, and time.  Skin: Skin is warm and dry. No rash noted. No erythema.  Psychiatric: She has a normal  mood and affect. Her behavior is normal.  Nursing note and vitals reviewed.   ED Course  Procedures (including critical care time) Labs Review Labs Reviewed  URINALYSIS, ROUTINE W REFLEX MICROSCOPIC (NOT AT [facility]) - Abnormal; Notable for the following:    APPearance CLOUDY (*)    Leukocytes, UA TRACE (*)    All other components within normal limits  URINE  MICROSCOPIC-ADD ON - Abnormal; Notable for the following:    Squamous Epithelial / LPF 6-30 (*)    Bacteria, UA MANY (*)    All other components within normal limits  PREGNANCY, URINE  CBC WITH DIFFERENTIAL/PLATELET  BASIC METABOLIC PANEL    Imaging Review No results found. I have personally reviewed and evaluated these images and lab results as part of my medical decision-making.   EKG Interpretation None      MDM   Final diagnoses:  Acute intractable headache, unspecified headache type    Patient is a healthy 20 year old female with multiple vague complaints of intermittent headache for the last week that comes and goes spontaneously. She has not taken anything for the headache but currently does not have a headache. Also after eating salty food she notices mild swelling of her feet but she's not currently having those symptoms either. She denies any neurologic complaints, no abdominal pain or vomiting. Last menses was last week and normal. She does have a strong family history of diabetes and hypertension in her mother was concerned that she may have that going on. Patient does not have a regular doctor and has not had a checkup in some time. Initial blood pressure was mildly elevated at 148/93 however repeat check was 115/82. Serum pregnancy test is negative, UA within normal limits, CBC, BMP within normal limits. No evidence of hypertension or diabetes. Findings were discussed with the patient and she was discharged home. She was encouraged to follow-up with family doctor.    Gwyneth Sprout, MD 09/14/15 [phone]

## 2015-09-14 NOTE — Discharge Instructions (Signed)

## 2017-07-23 ENCOUNTER — Encounter (HOSPITAL_BASED_OUTPATIENT_CLINIC_OR_DEPARTMENT_OTHER): Payer: Self-pay | Admitting: *Deleted

## 2017-07-23 ENCOUNTER — Other Ambulatory Visit: Payer: Self-pay

## 2017-07-23 ENCOUNTER — Emergency Department (HOSPITAL_BASED_OUTPATIENT_CLINIC_OR_DEPARTMENT_OTHER)
Admission: EM | Admit: 2017-07-23 | Discharge: 2017-07-23 | Disposition: A | Discharge status: Home / Self Care | Payer: Self-pay | Attending: Physician Assistant | Admitting: Physician Assistant

## 2017-07-23 DIAGNOSIS — N898 Other specified noninflammatory disorders of vagina: Secondary | ICD-10-CM | POA: Insufficient documentation

## 2017-07-23 DIAGNOSIS — F17200 Nicotine dependence, unspecified, uncomplicated: Secondary | ICD-10-CM | POA: Insufficient documentation

## 2017-07-23 DIAGNOSIS — R1030 Lower abdominal pain, unspecified: Secondary | ICD-10-CM | POA: Insufficient documentation

## 2017-07-23 LAB — URINALYSIS, ROUTINE W REFLEX MICROSCOPIC
BILIRUBIN URINE: NEGATIVE
Glucose, UA: NEGATIVE mg/dL
HGB URINE DIPSTICK: NEGATIVE
Ketones, ur: NEGATIVE mg/dL
Leukocytes, UA: NEGATIVE
Nitrite: NEGATIVE
PROTEIN: NEGATIVE mg/dL
Specific Gravity, Urine: >1.03 — ABNORMAL HIGH (ref 1.005–1.030)
pH: 6 (ref 5.0–8.0)

## 2017-07-23 LAB — WET PREP, GENITAL
Clue Cells Wet Prep HPF POC: NONE SEEN
Sperm: NONE SEEN
Trich, Wet Prep: NONE SEEN
Yeast Wet Prep HPF POC: NONE SEEN

## 2017-07-23 LAB — PREGNANCY, URINE: PREG TEST UR: NEGATIVE

## 2017-07-23 MED ORDER — AZITHROMYCIN 250 MG PO TABS
1000.0000 mg | ORAL_TABLET | Freq: Once | ORAL | Status: AC
  Administered 2017-07-23: 1000 mg via ORAL
  Filled 2017-07-23: qty 4

## 2017-07-23 MED ORDER — CEFTRIAXONE SODIUM 250 MG IJ SOLR
250.0000 mg | Freq: Once | INTRAMUSCULAR | Status: AC
  Administered 2017-07-23: 250 mg via INTRAMUSCULAR
  Filled 2017-07-23: qty 250

## 2017-07-23 MED ORDER — ONDANSETRON 4 MG PO TBDP
4.0000 mg | ORAL_TABLET | Freq: Once | ORAL | Status: AC
  Administered 2017-07-23: 4 mg via ORAL
  Filled 2017-07-23: qty 1

## 2017-07-23 NOTE — ED Triage Notes (Signed)
Pt c/o vaginal discharge x 1 week with lower abd pain and back pain

## 2017-07-23 NOTE — ED Notes (Signed)
Pt verbalizes understanding of d/c instructions and denies any further needs at this time. 

## 2017-07-23 NOTE — ED Provider Notes (Signed)
[facility] EMERGENCY DEPARTMENT Provider Note   CSN: [id number] Arrival date & time: 07/23/17  1417     History   Chief Complaint Chief Complaint  Patient presents with  . Vaginal Discharge    HPI Traci Carney is a 22 y.o. female.  HPI   Patient is a 22 year old female presenting with lower abdominal pain and occasional back pains for the last couple weeks.  Patient reports that she is had BV multiple times in the past and feels like it may be similar.  Patient has noticed discharge recently.  History reviewed. No pertinent past medical history.  There are no active problems to display for this patient.   Past Surgical History:  Procedure Laterality Date  . HERNIA REPAIR       OB History   None      Home Medications    Prior to Admission medications   Not on File    Family History History reviewed. No pertinent family history.  Social History Social History   Tobacco Use  . Smoking status: Current Some Day Smoker  . Smokeless tobacco: Never Used  Substance Use Topics  . Alcohol use: Yes    Comment: occasionally   . Drug use: No     Allergies   Patient has no known allergies.   Review of Systems Review of Systems  Constitutional: Negative for activity change, fatigue and fever.  Respiratory: Negative for shortness of breath.   Cardiovascular: Negative for chest pain.  Gastrointestinal: Negative for abdominal pain.  Genitourinary: Positive for vaginal discharge. Negative for difficulty urinating, dyspareunia, dysuria, hematuria and vaginal bleeding.  All other systems reviewed and are negative.    Physical Exam Updated Vital Signs BP 137/87   Pulse 68   Temp 98.3 F (36.8 C) (Oral)   Resp 16   Ht 5\' 6"  (1.676 m)   Wt 68 kg (150 lb)   LMP 06/27/2017   SpO2 99%   BMI 24.21 kg/m   Physical Exam  Constitutional: She is oriented to person, place, and time. She appears well-developed and well-nourished.  HENT:  Head:  Normocephalic and atraumatic.  Eyes: Conjunctivae are normal. Right eye exhibits no discharge.  Neck: Neck supple.  Cardiovascular: Normal rate, regular rhythm and normal heart sounds.  No murmur heard. Pulmonary/Chest: Effort normal and breath sounds normal. She has no wheezes. She has no rales.  Abdominal: Soft. She exhibits no distension. There is no tenderness.  Musculoskeletal: Normal range of motion. She exhibits no edema.  Neurological: She is oriented to person, place, and time. No cranial nerve deficit.  Skin: Skin is warm and dry. No rash noted. She is not diaphoretic.  Psychiatric: She has a normal mood and affect. Her behavior is normal.  Nursing note and vitals reviewed.  NO CMT, white discharge, normal cervux  ED Treatments / Results  Labs (all labs ordered are listed, but only abnormal results are displayed) Labs Reviewed  WET PREP, GENITAL - Abnormal; Notable for the following components:      Result Value   WBC, Wet Prep HPF POC MODERATE (*)    All other components within normal limits  URINALYSIS, ROUTINE W REFLEX MICROSCOPIC - Abnormal; Notable for the following components:   Specific Gravity, Urine >1.030 (*)    All other components within normal limits  URINE CULTURE  PREGNANCY, URINE  GC/CHLAMYDIA PROBE AMP ([place]) NOT AT [facility]    EKG None  Radiology No results found.  Procedures Procedures (including critical  care time)  Medications Ordered in ED Medications - No data to display   Initial Impression / Assessment and Plan / ED Course  I have reviewed the triage vital signs and the nursing notes.  Pertinent labs & imaging results that were available during my care of the patient were reviewed by me and considered in my medical decision making (see chart for details).      Patient is a 22 year old female presenting with lower abdominal pain and occasional back pains for the last couple weeks.  Patient reports that she is had BV multiple  times in the past and feels like it may be similar.  Patient has noticed discharge recently.  3:18 PM No clue cells.  No yeast.  Given patient is high risk sexual behavior, will treat for gonorrhea chlamydia.  No need to treat for PID given no CMT.    Final Clinical Impressions(s) / ED Diagnoses   Final diagnoses:  None    ED Discharge Orders    None       Ren Aspinall, Cindee Saltourteney Lyn, MD 07/23/17 1521

## 2017-07-23 NOTE — Discharge Instructions (Addendum)
You are found to have some vaginal discharge today.  We treated you for STDs just in case (though your results may not be back for over a week)  You should follow-up with her primary care physician.  Return if you are still having symptoms or not feeling improved in 3-5 days.

## 2017-07-24 LAB — HIV ANTIBODY (ROUTINE TESTING W REFLEX): HIV SCREEN 4TH GENERATION: NONREACTIVE

## 2017-07-24 LAB — URINE CULTURE: CULTURE: NO GROWTH

## 2017-07-24 LAB — GC/CHLAMYDIA PROBE AMP (~~LOC~~) NOT AT ARMC
CHLAMYDIA, DNA PROBE: NEGATIVE
NEISSERIA GONORRHEA: NEGATIVE

## 2017-07-24 LAB — RPR: RPR: NONREACTIVE

## 2017-09-30 ENCOUNTER — Other Ambulatory Visit: Payer: Self-pay

## 2017-09-30 ENCOUNTER — Other Ambulatory Visit (HOSPITAL_BASED_OUTPATIENT_CLINIC_OR_DEPARTMENT_OTHER): Payer: Self-pay | Admitting: Radiology

## 2017-09-30 ENCOUNTER — Encounter (HOSPITAL_BASED_OUTPATIENT_CLINIC_OR_DEPARTMENT_OTHER): Payer: Self-pay

## 2017-09-30 ENCOUNTER — Emergency Department (HOSPITAL_BASED_OUTPATIENT_CLINIC_OR_DEPARTMENT_OTHER): Payer: Medicaid Other

## 2017-09-30 ENCOUNTER — Emergency Department (HOSPITAL_BASED_OUTPATIENT_CLINIC_OR_DEPARTMENT_OTHER)
Admission: EM | Admit: 2017-09-30 | Discharge: 2017-09-30 | Disposition: A | Discharge status: Home / Self Care | Payer: Medicaid Other | Attending: Emergency Medicine | Admitting: Emergency Medicine

## 2017-09-30 DIAGNOSIS — Z3A01 Less than 8 weeks gestation of pregnancy: Secondary | ICD-10-CM | POA: Insufficient documentation

## 2017-09-30 DIAGNOSIS — O9989 Other specified diseases and conditions complicating pregnancy, childbirth and the puerperium: Secondary | ICD-10-CM | POA: Insufficient documentation

## 2017-09-30 DIAGNOSIS — O3481 Maternal care for other abnormalities of pelvic organs, first trimester: Secondary | ICD-10-CM | POA: Diagnosis not present

## 2017-09-30 DIAGNOSIS — M549 Dorsalgia, unspecified: Secondary | ICD-10-CM

## 2017-09-30 DIAGNOSIS — M545 Low back pain, unspecified: Secondary | ICD-10-CM

## 2017-09-30 DIAGNOSIS — F17200 Nicotine dependence, unspecified, uncomplicated: Secondary | ICD-10-CM | POA: Diagnosis not present

## 2017-09-30 DIAGNOSIS — B9689 Other specified bacterial agents as the cause of diseases classified elsewhere: Secondary | ICD-10-CM

## 2017-09-30 DIAGNOSIS — Z331 Pregnant state, incidental: Secondary | ICD-10-CM

## 2017-09-30 DIAGNOSIS — O99331 Smoking (tobacco) complicating pregnancy, first trimester: Secondary | ICD-10-CM | POA: Insufficient documentation

## 2017-09-30 DIAGNOSIS — O23599 Infection of other part of genital tract in pregnancy, unspecified trimester: Secondary | ICD-10-CM | POA: Diagnosis not present

## 2017-09-30 DIAGNOSIS — N760 Acute vaginitis: Secondary | ICD-10-CM

## 2017-09-30 DIAGNOSIS — N83209 Unspecified ovarian cyst, unspecified side: Secondary | ICD-10-CM

## 2017-09-30 LAB — COMPREHENSIVE METABOLIC PANEL
ALBUMIN: 4.2 g/dL (ref 3.5–5.0)
ALK PHOS: 58 U/L (ref 38–126)
ALT: 22 U/L (ref 0–44)
ANION GAP: 6 (ref 5–15)
AST: 20 U/L (ref 15–41)
BUN: 10 mg/dL (ref 6–20)
CALCIUM: 8.9 mg/dL (ref 8.9–10.3)
CO2: 23 mmol/L (ref 22–32)
Chloride: 106 mmol/L (ref 98–111)
Creatinine, Ser: 0.7 mg/dL (ref 0.44–1.00)
GFR calc Af Amer: >60 mL/min (ref >60)
GFR calc non Af Amer: >60 mL/min (ref >60)
Glucose, Bld: 83 mg/dL (ref 70–99)
POTASSIUM: 3.9 mmol/L (ref 3.5–5.1)
SODIUM: 135 mmol/L (ref 135–145)
TOTAL PROTEIN: 7 g/dL (ref 6.5–8.1)
Total Bilirubin: 0.3 mg/dL (ref 0.3–1.2)

## 2017-09-30 LAB — CBC WITH DIFFERENTIAL/PLATELET
BASOS PCT: 0 %
Basophils Absolute: 0 10*3/uL (ref 0.0–0.1)
EOS ABS: 0.1 10*3/uL (ref 0.0–0.7)
EOS PCT: 1 %
HCT: 36.4 % (ref 36.0–46.0)
Hemoglobin: 12.6 g/dL (ref 12.0–15.0)
Lymphocytes Relative: 28 %
Lymphs Abs: 2 10*3/uL (ref 0.7–4.0)
MCH: 27.8 pg (ref 26.0–34.0)
MCHC: 34.6 g/dL (ref 30.0–36.0)
MCV: 80.4 fL (ref 78.0–100.0)
MONO ABS: 0.6 10*3/uL (ref 0.1–1.0)
MONOS PCT: 8 %
Neutro Abs: 4.6 10*3/uL (ref 1.7–7.7)
Neutrophils Relative %: 63 %
Platelets: 252 10*3/uL (ref 150–400)
RBC: 4.53 MIL/uL (ref 3.87–5.11)
RDW: 14 % (ref 11.5–15.5)
WBC: 7.3 10*3/uL (ref 4.0–10.5)

## 2017-09-30 LAB — WET PREP, GENITAL
Sperm: NONE SEEN
TRICH WET PREP: NONE SEEN
YEAST WET PREP: NONE SEEN

## 2017-09-30 LAB — URINALYSIS, ROUTINE W REFLEX MICROSCOPIC
BILIRUBIN URINE: NEGATIVE
Glucose, UA: NEGATIVE mg/dL
Hgb urine dipstick: NEGATIVE
Ketones, ur: 15 mg/dL — AB
LEUKOCYTES UA: NEGATIVE
NITRITE: NEGATIVE
PH: 6.5 (ref 5.0–8.0)
Protein, ur: NEGATIVE mg/dL
SPECIFIC GRAVITY, URINE: 1.025 (ref 1.005–1.030)

## 2017-09-30 LAB — HCG, QUANTITATIVE, PREGNANCY: HCG, BETA CHAIN, QUANT, S: 8094 m[IU]/mL — AB (ref <5)

## 2017-09-30 LAB — PREGNANCY, URINE: PREG TEST UR: POSITIVE — AB

## 2017-09-30 MED ORDER — PRENATAL COMPLETE 14-0.4 MG PO TABS: 1.0000 | ORAL_TABLET | Freq: Every day | ORAL | 2 refills | Status: DC

## 2017-09-30 MED ORDER — ONDANSETRON HCL 4 MG/2ML IJ SOLN
4.0000 mg | Freq: Once | INTRAMUSCULAR | Status: AC
Start: 2017-09-30 — End: 2017-09-30
  Administered 2017-09-30: 4 mg via INTRAVENOUS
  Filled 2017-09-30: qty 2

## 2017-09-30 MED ORDER — METRONIDAZOLE 500 MG PO TABS
500.0000 mg | ORAL_TABLET | Freq: Two times a day (BID) | ORAL | 0 refills | Status: DC
Start: 2017-09-30 — End: 2017-10-19

## 2017-09-30 MED ORDER — ACETAMINOPHEN 500 MG PO TABS
1000.0000 mg | ORAL_TABLET | Freq: Once | ORAL | Status: AC
  Administered 2017-09-30: 1000 mg via ORAL
  Filled 2017-09-30: qty 2

## 2017-09-30 MED ORDER — SODIUM CHLORIDE 0.9 % IV BOLUS
1000.0000 mL | Freq: Once | INTRAVENOUS | Status: AC
  Administered 2017-09-30: 1000 mL via INTRAVENOUS

## 2017-09-30 NOTE — ED Provider Notes (Signed)
[facility] EMERGENCY DEPARTMENT Provider Note   CSN: [id number] Arrival date & time: 09/30/17  1510     History   Chief Complaint Chief Complaint  Patient presents with  . Back Pain    HPI Traci Carney is a 22 y.o. otherwise healthy G1P0 female with a PSHx of hernia repair at 22y/o (unknown type of hernia, possibly inguinal?), who presents to the ED with complaints of right-sided lower back pain for the last week.  She denies any injury or trauma, denies any heavy lifting, states that she works at [employer] so she is on her feet a lot but denies anything that would have caused her back pain.  She describes her pain as 6/10 intermittent aching and cramping right lower back pain that occasionally radiates into the right lateral thigh, with no known aggravating factors, and somewhat improved with Aleve.  She states that she thought that she might have a UTI because she has had some increased urinary frequency and urgency but has very little urine output with each attempt to urinate.  2 days ago she had some white creamy vaginal discharge that resolved within a day and is no longer ongoing.  She reports that 2 days ago she also had some mild suprapubic discomfort but that also resolved that day and is no longer ongoing.  She is sexually active with 1 female partner, unprotected.  She had a menses on 5/1-6/19 and then again 5/29-09/01/17 (so the second one started a little earlier than usual); has not had one since then, but is only 2 days late for the one she would've expected in July.  She states she usually has regularly timed menses.  She denies fevers, chills, CP, SOB, ongoing abd pain, N/V/D/C, hematuria, dysuria, malodorous urine, ongoing vaginal discharge, vaginal bleeding, incontinence of urine/stool, saddle anesthesia/cauda equina symptoms, myalgias, arthralgias, numbness, tingling, focal weakness, or any other complaints at this time.  She denies hx of CA or IVDU.   The history is  provided by the patient and medical records. No language interpreter was used.  Back Pain   Pertinent negatives include no chest pain, no fever, no numbness, no abdominal pain (some suprapubic discomfort 2 days ago that resolved quickly), no dysuria and no weakness.    History reviewed. No pertinent past medical history.  There are no active problems to display for this patient.   Past Surgical History:  Procedure Laterality Date  . HERNIA REPAIR       OB History   None      Home Medications    Prior to Admission medications   Not on File    Family History No family history on file.  Social History Social History   Tobacco Use  . Smoking status: Current Some Day Smoker  . Smokeless tobacco: Never Used  Substance Use Topics  . Alcohol use: Yes    Comment: occasionally   . Drug use: No     Allergies   Patient has no known allergies.   Review of Systems Review of Systems  Constitutional: Negative for chills and fever.  Respiratory: Negative for shortness of breath.   Cardiovascular: Negative for chest pain.  Gastrointestinal: Negative for abdominal pain (some suprapubic discomfort 2 days ago that resolved quickly), constipation, diarrhea, nausea and vomiting.  Genitourinary: Positive for frequency, urgency and vaginal discharge (white creamy d/c 2 days ago but stopped after 1 day). Negative for difficulty urinating (no incontinence), dysuria, hematuria and vaginal bleeding.  Musculoskeletal: Positive  for back pain. Negative for arthralgias and myalgias.  Skin: Negative for color change.  Allergic/Immunologic: Negative for immunocompromised state.  Neurological: Negative for weakness and numbness.  Psychiatric/Behavioral: Negative for confusion.   All other systems reviewed and are negative for acute change except as noted in the HPI.    Physical Exam Updated Vital Signs BP 139/87 (BP Location: Left Arm)   Pulse 90   Temp 98.6 F (37 C) (Oral)   Resp 18    Ht 5\' 6"  (1.676 m)   Wt 72.9 kg (160 lb 11.5 oz)   LMP 08/27/2017   SpO2 100%   BMI 25.94 kg/m   Physical Exam  Constitutional: She is oriented to person, place, and time. Vital signs are normal. She appears well-developed and well-nourished.  Non-toxic appearance. No distress.  Afebrile, nontoxic, NAD  HENT:  Head: Normocephalic and atraumatic.  Mouth/Throat: Oropharynx is clear and moist and mucous membranes are normal.  Eyes: Conjunctivae and EOM are normal. Right eye exhibits no discharge. Left eye exhibits no discharge.  Neck: Normal range of motion. Neck supple.  Cardiovascular: Normal rate, regular rhythm, normal heart sounds and intact distal pulses. Exam reveals no gallop and no friction rub.  No murmur heard. Pulmonary/Chest: Effort normal and breath sounds normal. No respiratory distress. She has no decreased breath sounds. She has no wheezes. She has no rhonchi. She has no rales.  Abdominal: Soft. Normal appearance and bowel sounds are normal. She exhibits no distension. There is no tenderness. There is no rigidity, no rebound, no guarding, no CVA tenderness, no tenderness at McBurney's point and negative Murphy's sign.  Soft, nondistended, +BS throughout, with no abdominal TTP however pt states it feels "weird" with palpation to the suprapubic area, but not painful; no other areas of discomfort/TTP; no r/g/r, neg murphy's, neg mcburney's, no CVA TTP   Genitourinary: Uterus normal. Pelvic exam was performed with patient supine. There is no rash, tenderness or lesion on the right labia. There is no rash, tenderness or lesion on the left labia. Cervix exhibits no motion tenderness, no discharge and no friability. Right adnexum displays no mass, no tenderness and no fullness. Left adnexum displays no mass, no tenderness and no fullness. No erythema, tenderness or bleeding in the vagina. Vaginal discharge found.  Genitourinary Comments: Chaperone present for exam. No rashes, lesions,  or tenderness to external genitalia. No erythema, injury, or tenderness to vaginal mucosa. Mild amount of white thin physiologic appearing vaginal discharge without bleeding within vaginal vault. No adnexal masses, tenderness, or fullness. No CMT, cervical friability, or discharge from cervical os. Cervical os is closed. Uterus non-deviated, mobile, nonTTP, and without enlargement.    Musculoskeletal: Normal range of motion.       Lumbar back: She exhibits normal range of motion, no tenderness, no bony tenderness and no spasm.  Lumbar spine with FROM intact without spinous process TTP, no bony stepoffs or deformities, no paraspinous muscle TTP or muscle spasms. Strength and sensation grossly intact in all extremities, gait steady. No overlying skin changes. Distal pulses intact.  MAE x4  Neurological: She is alert and oriented to person, place, and time. She has normal strength. No sensory deficit.  Skin: Skin is warm, dry and intact. No rash noted.  Psychiatric: She has a normal mood and affect.  Nursing note and vitals reviewed.    ED Treatments / Results  Labs (all labs ordered are listed, but only abnormal results are displayed) Labs Reviewed  WET PREP, GENITAL - Abnormal; Notable  for the following components:      Result Value   Clue Cells Wet Prep HPF POC PRESENT (*)    WBC, Wet Prep HPF POC FEW (*)    All other components within normal limits  URINALYSIS, ROUTINE W REFLEX MICROSCOPIC - Abnormal; Notable for the following components:   APPearance CLOUDY (*)    Ketones, ur 15 (*)    All other components within normal limits  PREGNANCY, URINE - Abnormal; Notable for the following components:   Preg Test, Ur POSITIVE (*)    All other components within normal limits  HCG, QUANTITATIVE, PREGNANCY - Abnormal; Notable for the following components:   hCG, Beta Chain, Quant, S 8,094 (*)    All other components within normal limits  URINE CULTURE  CBC WITH DIFFERENTIAL/PLATELET    COMPREHENSIVE METABOLIC PANEL  RPR  HIV ANTIBODY (ROUTINE TESTING)  GC/CHLAMYDIA PROBE AMP ([place]) NOT AT [facility]    EKG None  Radiology US Ob Comp Less 14 Wks  Result Date: 09/30/2017 CLINICAL DATA:  Pelvic pain with positive pregnancy test EXAM: OBSTETRIC <14 WK [place] AND TRANSVAGINAL OB US DOPPLER ULTRASOUND OF OVARIES TECHNIQUE: Both transabdominal and transvaginal ultrasound examinations were performed for complete evaluation of the gestation as well as the maternal uterus, adnexal regions, and pelvic cul-de-sac. Transvaginal technique was performed to assess early pregnancy. Color and duplex Doppler ultrasound was utilized to evaluate blood flow to the ovaries. COMPARISON:  None. FINDINGS: Intrauterine gestational sac: Visualized Yolk sac:  Not visualized Embryo:  Not visualized Cardiac Activity: Not visualized MSD: 7 mm   5 w   3 d Subchorionic hemorrhage:  None visualized. Maternal uterus/adnexae: Cervical os is closed. Uterus has a septate appearance. Right ovary measures 4.5 x 2.2 x 2.3 cm. Left ovary measures 3.4 x 2.1 x 1.8 cm. Within the right ovary, there is a debris-filled cystic area measuring 1.4 x 1.4 cm, likely a hemorrhagic corpus luteum. No other extrauterine pelvic mass. There is moderate free fluid in the cul-de-sac region containing debris. Pulsed Doppler evaluation of both ovaries demonstrates normal appearing low-resistance arterial and venous waveforms. IMPRESSION: 1. Within the uterus, there is an apparent gestational sac. Fetal pole not seen. Probable early intrauterine gestational sac, but no yolk sac, fetal pole, or cardiac activity yet visualized. Recommend follow-up quantitative B-HCG levels and follow-up US in 14 days to assess viability. This recommendation follows SRU consensus guidelines: Diagnostic Criteria for Nonviable Pregnancy Early in the First Trimester. Malva Limes Med 2013; 161:0960-45. 2.  Septate uterus.  Cervical os closed. 3. Probable hemorrhagic corpus  luteum in right ovary. Particular attention to this area on subsequent evaluations warranted. 4. No ovarian torsion torsion appreciable. Normal appearing ovarian Doppler waveforms bilaterally. Ovaries appear normal in size and contour. 5. Free fluid in the cul-de-sac, moderate, containing debris. Suspect recent ovarian cyst rupture. Electronically Signed   By: Bretta Bang III M.D.   On: 09/30/2017 19:04   US Ob Transvaginal  Result Date: 09/30/2017 CLINICAL DATA:  Pelvic pain with positive pregnancy test EXAM: OBSTETRIC <14 WK [place] AND TRANSVAGINAL OB US DOPPLER ULTRASOUND OF OVARIES TECHNIQUE: Both transabdominal and transvaginal ultrasound examinations were performed for complete evaluation of the gestation as well as the maternal uterus, adnexal regions, and pelvic cul-de-sac. Transvaginal technique was performed to assess early pregnancy. Color and duplex Doppler ultrasound was utilized to evaluate blood flow to the ovaries. COMPARISON:  None. FINDINGS: Intrauterine gestational sac: Visualized Yolk sac:  Not visualized Embryo:  Not visualized Cardiac Activity:  Not visualized MSD: 7 mm   5 w   3 d Subchorionic hemorrhage:  None visualized. Maternal uterus/adnexae: Cervical os is closed. Uterus has a septate appearance. Right ovary measures 4.5 x 2.2 x 2.3 cm. Left ovary measures 3.4 x 2.1 x 1.8 cm. Within the right ovary, there is a debris-filled cystic area measuring 1.4 x 1.4 cm, likely a hemorrhagic corpus luteum. No other extrauterine pelvic mass. There is moderate free fluid in the cul-de-sac region containing debris. Pulsed Doppler evaluation of both ovaries demonstrates normal appearing low-resistance arterial and venous waveforms. IMPRESSION: 1. Within the uterus, there is an apparent gestational sac. Fetal pole not seen. Probable early intrauterine gestational sac, but no yolk sac, fetal pole, or cardiac activity yet visualized. Recommend follow-up quantitative B-HCG levels and follow-up US in 14  days to assess viability. This recommendation follows SRU consensus guidelines: Diagnostic Criteria for Nonviable Pregnancy Early in the First Trimester. Malva Limes Engl J Med 2013; 478:2956-21; 369:1443-51. 2.  Septate uterus.  Cervical os closed. 3. Probable hemorrhagic corpus luteum in right ovary. Particular attention to this area on subsequent evaluations warranted. 4. No ovarian torsion torsion appreciable. Normal appearing ovarian Doppler waveforms bilaterally. Ovaries appear normal in size and contour. 5. Free fluid in the cul-de-sac, moderate, containing debris. Suspect recent ovarian cyst rupture. Electronically Signed   By: Bretta BangWilliam  Woodruff III M.D.   On: 09/30/2017 19:04   [place] Pelvic Doppler (torsion R/o Or Mass Arterial Flow)  Result Date: 09/30/2017 CLINICAL DATA:  Pelvic pain with positive pregnancy test EXAM: OBSTETRIC <14 WK US AND TRANSVAGINAL OB US DOPPLER ULTRASOUND OF OVARIES TECHNIQUE: Both transabdominal and transvaginal ultrasound examinations were performed for complete evaluation of the gestation as well as the maternal uterus, adnexal regions, and pelvic cul-de-sac. Transvaginal technique was performed to assess early pregnancy. Color and duplex Doppler ultrasound was utilized to evaluate blood flow to the ovaries. COMPARISON:  None. FINDINGS: Intrauterine gestational sac: Visualized Yolk sac:  Not visualized Embryo:  Not visualized Cardiac Activity: Not visualized MSD: 7 mm   5 w   3 d Subchorionic hemorrhage:  None visualized. Maternal uterus/adnexae: Cervical os is closed. Uterus has a septate appearance. Right ovary measures 4.5 x 2.2 x 2.3 cm. Left ovary measures 3.4 x 2.1 x 1.8 cm. Within the right ovary, there is a debris-filled cystic area measuring 1.4 x 1.4 cm, likely a hemorrhagic corpus luteum. No other extrauterine pelvic mass. There is moderate free fluid in the cul-de-sac region containing debris. Pulsed Doppler evaluation of both ovaries demonstrates normal appearing low-resistance arterial  and venous waveforms. IMPRESSION: 1. Within the uterus, there is an apparent gestational sac. Fetal pole not seen. Probable early intrauterine gestational sac, but no yolk sac, fetal pole, or cardiac activity yet visualized. Recommend follow-up quantitative B-HCG levels and follow-up US in 14 days to assess viability. This recommendation follows SRU consensus guidelines: Diagnostic Criteria for Nonviable Pregnancy Early in the First Trimester. Malva Limes Engl J Med 2013; 308:6578-46; 369:1443-51. 2.  Septate uterus.  Cervical os closed. 3. Probable hemorrhagic corpus luteum in right ovary. Particular attention to this area on subsequent evaluations warranted. 4. No ovarian torsion torsion appreciable. Normal appearing ovarian Doppler waveforms bilaterally. Ovaries appear normal in size and contour. 5. Free fluid in the cul-de-sac, moderate, containing debris. Suspect recent ovarian cyst rupture. Electronically Signed   By: Bretta BangWilliam  Woodruff III M.D.   On: 09/30/2017 19:04    Procedures Procedures (including critical care time)  Medications Ordered in ED Medications  sodium chloride 0.9 %  bolus 1,000 mL (0 mLs Intravenous Stopped 09/30/17 1756)  ondansetron (ZOFRAN) injection 4 mg (4 mg Intravenous Given 09/30/17 1657)  acetaminophen (TYLENOL) tablet 1,000 mg (1,000 mg Oral Given 09/30/17 1657)     Initial Impression / Assessment and Plan / ED Course  I have reviewed the triage vital signs and the nursing notes.  Pertinent labs & imaging results that were available during my care of the patient were reviewed by me and considered in my medical decision making (see chart for details).     22 y.o. female here with R lower back pain x1 wk and increased urinary frequency/urgency but little UOP with each attempt. Had some suprapubic pain earlier in the week but that resolved; also had some white creamy vaginal d/c earlier in the week for one day but that also resolved. On exam, no abdominal tenderness, reports it "feels weird"  with palpation to the suprapubic area but denies that it's painful, nonperitoneal and no other areas of TTP, no flank TTP. No midline spinal TTP, no red flag s/sx of cord compression or lower back pain. Upreg positive. U/A done in triage shows a few ketones but otherwise no nitrites/leuks/hgb, microscopy not done given normal results; will send for UCx given that she's pregnant. After informing pt of her positive pregnancy test, she became nauseated because of the shock. If this is true positive, she'd be about [redacted]w[redacted]d based on LMP of 08/27/17. Will proceed with getting labs including confirmatory quantHCG, perform pelvic exam and get STD testing, and give fluids/zofran, and give tylenol (per pt request) and then reassess. Will likely get pelvic U/S once pregnancy is confirmed on quantHCG, but will hold off on imaging orders for now. Will reassess shortly.   5:07 PM CBC w/diff WNL. Remainder of labs pending. Pt feeling better after zofran. Pelvic exam reveals mild amount of thin white physiologic-appearing vaginal discharge, no CMT or adnexal tenderness. Will await confirmatory quantHCG and then decide on pelvic imaging or not. Will reassess shortly.   6:02 PM CMP WNL. Wet prep with +clue cells, few WBCs, no yeast/trich; doubt need for empiric GC/CT treatment given lack of concerning findings on exam and on wet prep, but will treat for BV. QuantHCG 1,610 which confirms pregnancy. Will proceed with pelvic U/S to ensure IUP and r/o ectopic vs ovarian torsion/etc. Will reassess shortly.   7:46 PM Pelvic U/S showing gestational sac within the uterus measuring ~[redacted]w[redacted]d which matches her EGA based on LMP (which would estimate her at [redacted]w[redacted]d); no fetal pole/yolk sac/cardiac activity seen yet, probably early IUP, recommends f/up U/S and betaHCG in 14 days to assess viability. Additional findings include septate uterus, closed cervical os, probable hemorrhagic corpus luteum in R ovary recommending attention to this area on  subsequent evals, no ovarian torsion, and moderate free fluid in cul-de-sac which contains debris, suspicious for recent ovarian cyst rupture. This could explain her pain, or it could potentially be from round ligament pain, however overall very reassuring work up today. Will start on prenatal vitamins, will send home with tx for BV, advised use of tylenol/heat for pain, discussed not using NSAIDs since she's currently pregnant. Discussed f/up with OBGYN in 3-5 days for recheck and to establish prenatal care and for ongoing management of any f/up labs/imaging that needs to be done to continue assessing viability of pregnancy and her ovarian cyst. Discussed abstinence until STD testing is back but doubt need for empiric tx of this today. F/up with health dept for future STD concerns. Safe  sex encouraged, and discussed having partners tested and treated before re-engaging in intercourse after abstinence period. Pelvic rest advised until symptoms resolve. Strict return precautions advised. I explained the diagnosis and have given explicit precautions to return to the ER including for any other new or worsening symptoms. The patient understands and accepts the medical plan as it's been dictated and I have answered their questions. Discharge instructions concerning home care and prescriptions have been given. The patient is STABLE and is discharged to home in good condition.    Final Clinical Impressions(s) / ED Diagnoses   Final diagnoses:  Acute right-sided low back pain without sciatica  Pregnancy as incidental finding  BV (bacterial vaginosis)  Back pain affecting pregnancy in first trimester  Ovarian cyst during pregnancy in first trimester  Ruptured ovarian cyst    ED Discharge Orders        Ordered    metroNIDAZOLE (FLAGYL) 500 MG tablet  2 times daily     09/30/17 1927    Prenatal Vit-Fe Fumarate-FA (PRENATAL COMPLETE) 14-0.4 MG TABS  Daily after breakfast     09/30/17 [address],  [place], PA-C 09/30/17 1946    Melene Plan, DO 09/30/17 2312

## 2017-09-30 NOTE — Discharge Instructions (Addendum)
Your work up has been reassuring today; your pregnancy test was positive, and we have confirmed that you are pregnant based on ultrasound however it's a very early pregnancy so you'll need to have another ultrasound in the next 2 weeks to confirm viability of this pregnancy. Back and abdominal pain in early pregnancy is common, and is usually due to round ligament pain which are the ligaments attached to the uterus, which stretch as your uterus grows. It could also be due to a ruptured ovarian cyst, which was also seen on your pelvic ultrasound today.   Start taking prenatal vitamins. You can use tylenol safely in pregnancy, use as needed for pain, but avoid NSAIDs like ibuprofen or aleve/motrin/etc. Use heat to the areas of pain, no more than 20 minutes per hour. Your vaginal swab showed that you have bacterial vaginosis, take flagyl as directed until completed, and don't drink alcohol while taking this medication. Allow for pelvic rest, avoiding douching, tampon use, intercourse, etc until your symptoms resolve. Follow up with an OBGYN in the next 3-5 days to recheck symptoms and to establish prenatal care; if you don't have an OBGYN that you see then you can call the women's outpatient clinic to establish OBGYN care. [facility]similar to their version of an emergency room) for changes/worsening symptoms.  Also, you have been tested for gonorrhea, chlamydia, HIV, and Syphilis, and the hospital lab will call you if the tests are positive. Do not have sexual intercourse until you've found out about your test results. Make sure all sexual partners are tested and treated before re-engaging in sexual intercourse. Follow up with the health department for future STD concerns/testing/treatment/etc.    SEE[facility] ATT[facility] IF: You have severe cramps in your stomach, back, or abdomen.  You have a sudden onset of severe pain in the lower part of your abdomen.   You run an unexplained temperature of 101 F (38.3 C) or higher.  You pass large clots or tissue. Save any tissue for your caregiver to inspect.  Your bleeding increases or you become light-headed, weak, or have fainting episodes.

## 2017-09-30 NOTE — ED Triage Notes (Signed)
C/o lower back pain x 1 week-denies injury-NAD-steady gait 

## 2017-10-01 ENCOUNTER — Inpatient Hospital Stay (HOSPITAL_COMMUNITY)
Admission: AD | Admit: 2017-10-01 | Discharge: 2017-10-01 | Disposition: A | Discharge status: Home / Self Care | Payer: Medicaid Other | Source: Ambulatory Visit | Attending: Obstetrics and Gynecology | Admitting: Obstetrics and Gynecology

## 2017-10-01 ENCOUNTER — Other Ambulatory Visit: Payer: Self-pay

## 2017-10-01 ENCOUNTER — Encounter (HOSPITAL_COMMUNITY): Payer: Self-pay | Admitting: *Deleted

## 2017-10-01 DIAGNOSIS — Z792 Long term (current) use of antibiotics: Secondary | ICD-10-CM | POA: Insufficient documentation

## 2017-10-01 DIAGNOSIS — Q5128 Other doubling of uterus, other specified: Secondary | ICD-10-CM

## 2017-10-01 DIAGNOSIS — Z9889 Other specified postprocedural states: Secondary | ICD-10-CM | POA: Diagnosis not present

## 2017-10-01 DIAGNOSIS — O99331 Smoking (tobacco) complicating pregnancy, first trimester: Secondary | ICD-10-CM | POA: Insufficient documentation

## 2017-10-01 DIAGNOSIS — N760 Acute vaginitis: Secondary | ICD-10-CM

## 2017-10-01 DIAGNOSIS — O3680X0 Pregnancy with inconclusive fetal viability, not applicable or unspecified: Secondary | ICD-10-CM

## 2017-10-01 DIAGNOSIS — R109 Unspecified abdominal pain: Secondary | ICD-10-CM | POA: Insufficient documentation

## 2017-10-01 DIAGNOSIS — B9689 Other specified bacterial agents as the cause of diseases classified elsewhere: Secondary | ICD-10-CM | POA: Diagnosis present

## 2017-10-01 DIAGNOSIS — Z3A01 Less than 8 weeks gestation of pregnancy: Secondary | ICD-10-CM | POA: Insufficient documentation

## 2017-10-01 DIAGNOSIS — Z79899 Other long term (current) drug therapy: Secondary | ICD-10-CM | POA: Insufficient documentation

## 2017-10-01 DIAGNOSIS — O26891 Other specified pregnancy related conditions, first trimester: Secondary | ICD-10-CM | POA: Diagnosis not present

## 2017-10-01 DIAGNOSIS — Q512 Other doubling of uterus, unspecified: Secondary | ICD-10-CM

## 2017-10-01 DIAGNOSIS — O3401 Maternal care for unspecified congenital malformation of uterus, first trimester: Secondary | ICD-10-CM | POA: Insufficient documentation

## 2017-10-01 LAB — GC/CHLAMYDIA PROBE AMP (~~LOC~~) NOT AT ARMC
Chlamydia: NEGATIVE
NEISSERIA GONORRHEA: NEGATIVE

## 2017-10-01 LAB — RPR: RPR Ser Ql: NONREACTIVE

## 2017-10-01 LAB — URINALYSIS, ROUTINE W REFLEX MICROSCOPIC
Bilirubin Urine: NEGATIVE
Glucose, UA: NEGATIVE mg/dL
HGB URINE DIPSTICK: NEGATIVE
Ketones, ur: 20 mg/dL — AB
LEUKOCYTES UA: NEGATIVE
Nitrite: NEGATIVE
PROTEIN: NEGATIVE mg/dL
Specific Gravity, Urine: 1.027 (ref 1.005–1.030)
pH: 5 (ref 5.0–8.0)

## 2017-10-01 LAB — HIV ANTIBODY (ROUTINE TESTING W REFLEX): HIV Screen 4th Generation wRfx: NONREACTIVE

## 2017-10-01 NOTE — MAU Provider Note (Signed)
  History     CSN: [id number]  Arrival date and time: 10/01/17 1234   First Provider Initiated Contact with Patient 10/01/17 1340      Chief Complaint  Patient presents with  . Abdominal Pain   Ms. Karle PlumberRhonda Burkhalter is a 22 year old G1P0 at 5 weeks who presents today with a chief complaint of right lower back pain. She states yesterday she went to the ED for the same right-sided lower back pain and through urine pregancy she discovered she was pregnant. She received a transvaginal ultrasound which revealed she is [redacted] weeks pregnant with a right hemorrhagic corpus luteal cyst and a septate uterus. The patient's concerned about the success of her pregnancy and is worried about cancer after she researched more information. Patient was educated on structural differences of a septate uterus and possibility of a high risk pregnancy. She was provided reputable resources to research medical information. Patient states pregnancy was unplanned but wanted.   History reviewed. No pertinent past medical history.  Past Surgical History:  Procedure Laterality Date  . HERNIA REPAIR      History reviewed. No pertinent family history.  Social History   Tobacco Use  . Smoking status: Current Some Day Smoker  . Smokeless tobacco: Never Used  Substance Use Topics  . Alcohol use: Yes    Comment: occasionally   . Drug use: No    Allergies: No Known Allergies  Medications Prior to Admission  Medication Sig Dispense Refill Last Dose  . metroNIDAZOLE (FLAGYL) 500 MG tablet Take 1 tablet (500 mg total) by mouth 2 (two) times daily. One po bid x 7 days 14 tablet 0   . Prenatal Vit-Fe Fumarate-FA (PRENATAL COMPLETE) 14-0.4 MG TABS Take 1 tablet by mouth daily after breakfast. 30 each 2     Review of Systems  Constitutional: Negative for chills and fever.  Gastrointestinal: Negative for abdominal pain, nausea and vomiting.  Genitourinary: Positive for flank pain. Negative for decreased urine volume,  difficulty urinating, dysuria, hematuria, vaginal discharge and vaginal pain.       Right lower back   Physical Exam   Blood pressure 122/74, pulse 69, temperature 98.7 F (37.1 C), temperature source Oral, resp. rate 16, height 5\' 6"  (1.676 m), weight 72.5 kg (159 lb 12 oz), last menstrual period 08/27/2017, SpO2 99 %.  Physical Exam  Constitutional: She appears well-developed and well-nourished.  Patient presents anxious.  Cardiovascular: Normal rate, regular rhythm and normal heart sounds. Exam reveals no gallop and no friction rub.  No murmur heard. Respiratory: Breath sounds normal. She has no wheezes. She has no rales. She exhibits no tenderness.  GI: Bowel sounds are normal. She exhibits no distension. There is no tenderness.  No CVA tenderness.    MAU Course  MDM UA   Assessment and Plan  Primiparous Pregnancy, 5 weeks 3 days gestation Septate Uterus Right Ovarian Hemorrhagic Corpus Luteum  Follow up hcg quant 48 hours post quant from ED; patient will return for lab tomorrow Follow up transvaginal ultrasound in 1 week    Candie MileLindsey R Jyaire Koudelka 10/01/2017, 2:08 PM

## 2017-10-01 NOTE — MAU Note (Signed)
Pt found out she was pregnant yesterday in ED.  Possible ruptured ovarian cyst w/o torsion.  Today is feeling pain on on lower left side.

## 2017-10-01 NOTE — MAU Provider Note (Signed)
History     CSN: [id number]  Arrival date and time: 10/01/17 1234   First Provider Initiated Contact with Patient 10/01/17 1340      Chief Complaint  Patient presents with  . Abdominal Pain   HPI  Ms.  Traci Carney is a 22 y.o. year old G1P0 female at [redacted]w[redacted]d weeks gestation who presents to MAU reporting RT lower back pain. She was seen at [facility] on 09/30/17 where she was found to be pregnant with septate uterus (no prior dx), RT hemorrhagic CLC. She has been "freaked out" since she was given this information, because "no one explained any of it" to her. She is concerned that she "has cancer and will not be able to carry babies with a heart-shaped uterus". She reports that this pregnancy is unplanned, but desired.  *Pt is very tearful.  History reviewed. No pertinent past medical history.  Past Surgical History:  Procedure Laterality Date  . HERNIA REPAIR      History reviewed. No pertinent family history.  Social History   Tobacco Use  . Smoking status: Current Some Day Smoker  . Smokeless tobacco: Never Used  Substance Use Topics  . Alcohol use: Yes    Comment: occasionally   . Drug use: No    Allergies: No Known Allergies  Medications Prior to Admission  Medication Sig Dispense Refill Last Dose  . metroNIDAZOLE (FLAGYL) 500 MG tablet Take 1 tablet (500 mg total) by mouth 2 (two) times daily. One po bid x 7 days 14 tablet 0   . Prenatal Vit-Fe Fumarate-FA (PRENATAL COMPLETE) 14-0.4 MG TABS Take 1 tablet by mouth daily after breakfast. 30 each 2     Review of Systems  Constitutional: Negative.   HENT: Negative.   Eyes: Negative.   Respiratory: Negative.   Cardiovascular: Negative.   Gastrointestinal: Negative.   Endocrine: Negative.   Genitourinary: Negative.   Musculoskeletal: Positive for back pain (RT lower).  Skin: Negative.   Allergic/Immunologic: Negative.   Neurological: Negative.   Hematological: Negative.   Psychiatric/Behavioral:       "Anxious &  worried"   Physical Exam   Blood pressure 122/74, pulse 69, temperature 98.7 F (37.1 C), temperature source Oral, resp. rate 16, height 5\' 6"  (1.676 m), weight 159 lb 12 oz (72.5 kg), last menstrual period 08/27/2017, SpO2 99 %.  Physical Exam  Nursing note and vitals reviewed. Constitutional: She is oriented to person, place, and time. She appears well-developed and well-nourished.  HENT:  Head: Normocephalic and atraumatic.  Eyes: Pupils are equal, round, and reactive to light.  Neck: Normal range of motion.  Cardiovascular: Normal rate.  Respiratory: Effort normal.  Musculoskeletal: Normal range of motion.  Neurological: She is alert and oriented to person, place, and time.  Skin: Skin is warm and dry.  Psychiatric: She has a normal mood and affect. Her behavior is normal. Judgment and thought content normal.  Seems very worried    MAU Course  Procedures  MDM A thorough explanation of what a septate uterus is and what it means to pregnancy was given. Explained that she can carry babies, but may be considered high risk pregnancy. Discussed the plan of repeating HCG on 10/02/17. Explained the expectations of HCG levels in a normal pregnancy. Advised that she will have to wait for lab results and to speak to a MAU provider for at least 2 hrs.   Assessment and Plan  Pregnancy of unknown anatomic location  - Information provided on pregnancy of  unknown location and HCG test  - Outpatient US OB Transvaginal for viability ordered for 1 wk; F/U in [facility] immediately after U/S - F/U care with [facility] - Discharge patient - Return to MAU for rpt HCG on 10/02/17 - Patient verbalized an understanding of the plan of care and agrees.    Traci Moraolitta Alleene Stoy, MSN, CNM 10/01/2017, 1:40 PM

## 2017-10-01 NOTE — MAU Note (Signed)
Having some pain in lower rt side.  Sharp aching pain, off and on.  Some nausea. Hasn't ate since yesterday at 5, no appetite.  Found out preg yesterday.  Was treated for BV.

## 2017-10-02 ENCOUNTER — Inpatient Hospital Stay (HOSPITAL_COMMUNITY)
Admission: AD | Admit: 2017-10-02 | Discharge: 2017-10-02 | Disposition: A | Discharge status: Home / Self Care | Payer: Medicaid Other | Source: Ambulatory Visit | Attending: Obstetrics and Gynecology | Admitting: Obstetrics and Gynecology

## 2017-10-02 DIAGNOSIS — Z3A01 Less than 8 weeks gestation of pregnancy: Secondary | ICD-10-CM | POA: Diagnosis not present

## 2017-10-02 DIAGNOSIS — O0281 Inappropriate change in quantitative human chorionic gonadotropin (hCG) in early pregnancy: Secondary | ICD-10-CM | POA: Insufficient documentation

## 2017-10-02 DIAGNOSIS — R109 Unspecified abdominal pain: Secondary | ICD-10-CM

## 2017-10-02 DIAGNOSIS — O26891 Other specified pregnancy related conditions, first trimester: Secondary | ICD-10-CM

## 2017-10-02 LAB — URINE CULTURE

## 2017-10-02 LAB — HCG, QUANTITATIVE, PREGNANCY: hCG, Beta Chain, Quant, S: 16820 m[IU]/mL — ABNORMAL HIGH (ref <5)

## 2017-10-02 NOTE — MAU Note (Signed)
Pt here for repeat labs. No pain or bleeding.

## 2017-10-02 NOTE — Discharge Instructions (Signed)
First Trimester of Pregnancy The first trimester of pregnancy is from week 1 until the end of week 13 (months 1 through 3). During this time, your baby will begin to develop inside you. At 6-8 weeks, the eyes and face are formed, and the heartbeat can be seen on ultrasound. At the end of 12 weeks, all the baby's organs are formed. Prenatal care is all the medical care you receive before the birth of your baby. Make sure you get good prenatal care and follow all of your doctor's instructions. Follow these instructions at home: Medicines  Take over-the-counter and prescription medicines only as told by your doctor. Some medicines are safe and some medicines are not safe during pregnancy.  Take a prenatal vitamin that contains at least 600 micrograms (mcg) of folic acid.  If you have trouble pooping (constipation), take medicine that will make your stool soft (stool softener) if your doctor approves. Eating and drinking  Eat regular, healthy meals.  Your doctor will tell you the amount of weight gain that is right for you.  Avoid raw meat and uncooked cheese.  If you feel sick to your stomach (nauseous) or throw up (vomit): ? Eat 4 or 5 small meals a day instead of 3 large meals. ? Try eating a few soda crackers. ? Drink liquids between meals instead of during meals.  To prevent constipation: ? Eat foods that are high in fiber, like fresh fruits and vegetables, whole grains, and beans. ? Drink enough fluids to keep your pee (urine) clear or pale yellow. Activity  Exercise only as told by your doctor. Stop exercising if you have cramps or pain in your lower belly (abdomen) or low back.  Do not exercise if it is too hot, too humid, or if you are in a place of great height (high altitude).  Try to avoid standing for long periods of time. Move your legs often if you must stand in one place for a long time.  Avoid heavy lifting.  Wear low-heeled shoes. Sit and stand up straight.  You  can have sex unless your doctor tells you not to. Relieving pain and discomfort  Wear a good support bra if your breasts are sore.  Take warm water baths (sitz baths) to soothe pain or discomfort caused by hemorrhoids. Use hemorrhoid cream if your doctor says it is okay.  Rest with your legs raised if you have leg cramps or low back pain.  If you have puffy, bulging veins (varicose veins) in your legs: ? Wear support hose or compression stockings as told by your doctor. ? Raise (elevate) your feet for 15 minutes, 3-4 times a day. ? Limit salt in your food. Prenatal care  Schedule your prenatal visits by the twelfth week of pregnancy.  Write down your questions. Take them to your prenatal visits.  Keep all your prenatal visits as told by your doctor. This is important. Safety  Wear your seat belt at all times when driving.  Make a list of emergency phone numbers. The list should include numbers for family, friends, the hospital, and police and fire departments. General instructions  Ask your doctor for a referral to a local prenatal class. Begin classes no later than at the start of month 6 of your pregnancy.  Ask for help if you need counseling or if you need help with nutrition. Your doctor can give you advice or tell you where to go for help.  Do not use hot tubs, steam rooms, or   saunas.  Do not douche or use tampons or scented sanitary pads.  Do not cross your legs for long periods of time.  Avoid all herbs and alcohol. Avoid drugs that are not approved by your doctor.  Do not use any tobacco products, including cigarettes, chewing tobacco, and electronic cigarettes. If you need help quitting, ask your doctor. You may get counseling or other support to help you quit.  Avoid cat litter boxes and soil used by cats. These carry germs that can cause birth defects in the baby and can cause a loss of your baby (miscarriage) or stillbirth.  Visit your dentist. At home, brush  your teeth with a soft toothbrush. Be gentle when you floss. Contact a doctor if:  You are dizzy.  You have mild cramps or pressure in your lower belly.  You have a nagging pain in your belly area.  You continue to feel sick to your stomach, you throw up, or you have watery poop (diarrhea).  You have a bad smelling fluid coming from your vagina.  You have pain when you pee (urinate).  You have increased puffiness (swelling) in your face, hands, legs, or ankles. Get help right away if:  You have a fever.  You are leaking fluid from your vagina.  You have spotting or bleeding from your vagina.  You have very bad belly cramping or pain.  You gain or lose weight rapidly.  You throw up blood. It may look like coffee grounds.  You are around people who have German measles, fifth disease, or chickenpox.  You have a very bad headache.  You have shortness of breath.  You have any kind of trauma, such as from a fall or a car accident. Summary  The first trimester of pregnancy is from week 1 until the end of week 13 (months 1 through 3).  To take care of yourself and your unborn baby, you will need to eat healthy meals, take medicines only if your doctor tells you to do so, and do activities that are safe for you and your baby.  Keep all follow-up visits as told by your doctor. This is important as your doctor will have to ensure that your baby is healthy and growing well. This information is not intended to replace advice given to you by your health care provider. Make sure you discuss any questions you have with your health care provider. Document Released: 09/04/2007 Document Revised: 03/26/2016 Document Reviewed: 03/26/2016 Elsevier Interactive Patient Education  2017 [employer]  

## 2017-10-02 NOTE — MAU Provider Note (Signed)
First Provider Initiated Contact with Patient 10/02/17 2027     [name]  is a 22 y.o. G1P0 at 442w1d who presents to MAU today for follow-up quant hCG after 48 hours. She continues to have some mild cramping, unchanged from previously. She denies vaginal bleeding.   BP 126/80 (BP Location: Right Arm)   Pulse 73   Temp 98.8 F (37.1 C) (Oral)   Resp 16   LMP 08/27/2017   SpO2 99%   CONSTITUTIONAL: Well-developed, well-nourished female in no acute distress.  ENT: External right and left ear normal.  EYES: EOM intact, conjunctivae normal.  MUSCULOSKELETAL: Normal range of motion.  CARDIOVASCULAR: Regular heart rate RESPIRATORY: Normal effort NEUROLOGICAL: Alert and oriented to person, place, and time.  SKIN: Skin is warm and dry. No rash noted. Not diaphoretic. No erythema. No pallor. PSYCH: Normal mood and affect. Normal behavior. Normal judgment and thought content.  Results for Traci CarinaGUIDRY, [name] (MRN [id number]) as of 10/02/2017 20:39  Ref. Range 09/30/2017 16:50 09/30/2017 17:05 09/30/2017 18:41 10/01/2017 13:33 10/02/2017 19:18  HCG, Beta Chain, Quant, S Latest Ref Range: <5 mIU/mL 8,094 (H)    16,820 (H)   A: Appropriate rise in quant hCG after 48 hours  P: Discharge home First trimester/ectopic precautions discussed Patient will return for follow-up US in 1 week. Order placed at visit yesterday. They will call the patient with an appointment time Patient may return to MAU as needed or if her condition were to change or worsen   Kathlene CoteWenzel, Teniola Tseng N, PA-C 10/02/2017 8:27 PM

## 2017-10-08 ENCOUNTER — Ambulatory Visit (HOSPITAL_COMMUNITY): Payer: Self-pay

## 2017-10-08 ENCOUNTER — Ambulatory Visit (INDEPENDENT_AMBULATORY_CARE_PROVIDER_SITE_OTHER): Payer: Self-pay | Admitting: *Deleted

## 2017-10-08 ENCOUNTER — Ambulatory Visit (HOSPITAL_COMMUNITY)
Admission: RE | Admit: 2017-10-08 | Discharge: 2017-10-08 | Disposition: A | Discharge status: Home / Self Care | Payer: Medicaid Other | Source: Ambulatory Visit | Attending: Obstetrics and Gynecology | Admitting: Obstetrics and Gynecology

## 2017-10-08 ENCOUNTER — Encounter: Payer: Self-pay | Admitting: General Practice

## 2017-10-08 DIAGNOSIS — Z3A01 Less than 8 weeks gestation of pregnancy: Secondary | ICD-10-CM | POA: Insufficient documentation

## 2017-10-08 DIAGNOSIS — Z712 Person consulting for explanation of examination or test findings: Secondary | ICD-10-CM

## 2017-10-08 DIAGNOSIS — O3680X0 Pregnancy with inconclusive fetal viability, not applicable or unspecified: Secondary | ICD-10-CM

## 2017-10-08 DIAGNOSIS — O283 Abnormal ultrasonic finding on antenatal screening of mother: Secondary | ICD-10-CM | POA: Diagnosis not present

## 2017-10-08 NOTE — Progress Notes (Signed)
US results reviewed by Dr. Doroteo GlassmanPhelps.  Pt informed of results showing early pregnancy @ 5w 5d and low normal heart rate.  Pt advised to return to hospital if she develops severe abdominal/pelvic pain or heavy vaginal bleeding. Pt states she is very nauseous and cannot keep anything down except sips of ginger ale.  Pt advised she can take Unisom OTC. Also she should eat small amount of crackers and bland foods as tolerated. Stop taking PNV and Flagyl until nausea and vomiting is under control, then reintroduce medications separately.  Pt voiced understanding of all information given.

## 2017-10-08 NOTE — Progress Notes (Signed)
Chart reviewed for nurse visit. Agree with plan of care.   Pincus Largehelps, Latha Staunton Y, DO 10/08/2017 5:33 PM

## 2017-10-09 ENCOUNTER — Ambulatory Visit: Payer: Self-pay

## 2017-10-19 ENCOUNTER — Other Ambulatory Visit: Payer: Self-pay

## 2017-10-19 ENCOUNTER — Inpatient Hospital Stay (HOSPITAL_COMMUNITY)
Admission: AD | Admit: 2017-10-19 | Discharge: 2017-10-19 | Disposition: A | Discharge status: Home / Self Care | Payer: Medicaid Other | Source: Ambulatory Visit | Attending: Obstetrics and Gynecology | Admitting: Obstetrics and Gynecology

## 2017-10-19 ENCOUNTER — Encounter (HOSPITAL_COMMUNITY): Payer: Self-pay

## 2017-10-19 DIAGNOSIS — Z3A01 Less than 8 weeks gestation of pregnancy: Secondary | ICD-10-CM | POA: Diagnosis not present

## 2017-10-19 DIAGNOSIS — F17200 Nicotine dependence, unspecified, uncomplicated: Secondary | ICD-10-CM | POA: Diagnosis not present

## 2017-10-19 DIAGNOSIS — O99331 Smoking (tobacco) complicating pregnancy, first trimester: Secondary | ICD-10-CM | POA: Diagnosis not present

## 2017-10-19 DIAGNOSIS — O219 Vomiting of pregnancy, unspecified: Secondary | ICD-10-CM

## 2017-10-19 DIAGNOSIS — O2341 Unspecified infection of urinary tract in pregnancy, first trimester: Secondary | ICD-10-CM | POA: Diagnosis not present

## 2017-10-19 DIAGNOSIS — O210 Mild hyperemesis gravidarum: Secondary | ICD-10-CM | POA: Diagnosis not present

## 2017-10-19 LAB — WET PREP, GENITAL
Clue Cells Wet Prep HPF POC: NONE SEEN
Sperm: NONE SEEN
Trich, Wet Prep: NONE SEEN
YEAST WET PREP: NONE SEEN

## 2017-10-19 LAB — URINALYSIS, ROUTINE W REFLEX MICROSCOPIC
BILIRUBIN URINE: NEGATIVE
GLUCOSE, UA: NEGATIVE mg/dL
HGB URINE DIPSTICK: NEGATIVE
KETONES UR: NEGATIVE mg/dL
NITRITE: NEGATIVE
PROTEIN: 30 mg/dL — AB
Specific Gravity, Urine: 1.024 (ref 1.005–1.030)
pH: 7 (ref 5.0–8.0)

## 2017-10-19 MED ORDER — DEXTROSE IN LACTATED RINGERS 5 % IV SOLN
INTRAVENOUS | Status: DC
  Administered 2017-10-19: 11:00:00 via INTRAVENOUS

## 2017-10-19 MED ORDER — METOCLOPRAMIDE HCL 5 MG/ML IJ SOLN
10.0000 mg | Freq: Once | INTRAMUSCULAR | Status: AC
  Administered 2017-10-19: 10 mg via INTRAVENOUS
  Filled 2017-10-19: qty 2

## 2017-10-19 MED ORDER — CEPHALEXIN 500 MG PO CAPS: 500.0000 mg | ORAL_CAPSULE | Freq: Four times a day (QID) | ORAL | 0 refills | Status: DC

## 2017-10-19 MED ORDER — METOCLOPRAMIDE HCL 10 MG PO TABS: 10.0000 mg | ORAL_TABLET | Freq: Four times a day (QID) | ORAL | 1 refills | Status: DC

## 2017-10-19 NOTE — MAU Provider Note (Signed)
History     CSN: [id number]  Arrival date and time: 10/19/17 1024   First Provider Initiated Contact with Patient 10/19/17 1056      Chief Complaint  Patient presents with  . Morning Sickness   HPI  Traci Carney is a G1P0 at [redacted]w[redacted]d patient who presents to MAU with chief report of continuous nausea and vomiting for the past ten days. Actively vomiting upon arrival to MAU. States she has not had a solid meal in the past six days and is unable to tolerate PO intake beyond ice chips. Reports significant weight loss due to N/V, based on self-reported pre-pregnancy weight of 169lbs. Denies vaginal bleeding, leaking of fluid, fever, falls, or recent illness.     Patient called clinic and was instructed to discontinue prenatal vitamin and try OTC Unisom. States she took it once, it helped her nausea but she woke up with a headache and was scared to take it again.  Previously diagnosed with bacterial vaginosis. Took prescribed Flagyl for 3 days then discontinued due to extreme nausea/vomiting. Reports she has the same thick discharge that she had at diagnosis.  OB History    Gravida  1   Para      Term      Preterm      AB      Living        SAB      TAB      Ectopic      Multiple      Live Births              No past medical history on file.  Past Surgical History:  Procedure Laterality Date  . HERNIA REPAIR      No family history on file.  Social History   Tobacco Use  . Smoking status: Current Some Day Smoker  . Smokeless tobacco: Never Used  Substance Use Topics  . Alcohol use: Yes    Comment: occasionally   . Drug use: No    Allergies: No Known Allergies  Medications Prior to Admission  Medication Sig Dispense Refill Last Dose  . metroNIDAZOLE (FLAGYL) 500 MG tablet Take 1 tablet (500 mg total) by mouth 2 (two) times daily. One po bid x 7 days 14 tablet 0   . Prenatal Vit-Fe Fumarate-FA (PRENATAL COMPLETE) 14-0.4 MG TABS Take 1 tablet by mouth  daily after breakfast. 30 each 2     Review of Systems  Constitutional: Positive for fatigue. Negative for chills and fever.  Gastrointestinal: Positive for nausea and vomiting. Negative for abdominal pain, constipation and diarrhea.  Genitourinary: Positive for vaginal discharge. Negative for difficulty urinating, dyspareunia, dysuria, flank pain, vaginal bleeding and vaginal pain.  Neurological: Negative for dizziness, light-headedness and headaches.  All other systems reviewed and are negative.  Physical Exam   Blood pressure 127/79, pulse 79, temperature 98.3 F (36.8 C), temperature source Oral, resp. rate 16, last menstrual period 08/27/2017, SpO2 100 %.  Physical Exam  Nursing note and vitals reviewed. Constitutional: She is oriented to person, place, and time. She appears well-developed and well-nourished.  HENT:  Mouth/Throat: Mucous membranes are normal.  Cardiovascular: Normal rate, regular rhythm, normal heart sounds and intact distal pulses.  Respiratory: Effort normal and breath sounds normal.  GI: Soft. Bowel sounds are normal. There is no tenderness. There is no rebound.  Genitourinary: Vagina normal and uterus normal. No vaginal discharge found.  Musculoskeletal: Normal range of motion.  Neurological: She is alert and oriented  to person, place, and time. She has normal reflexes.  Skin: Skin is warm and dry.  Psychiatric: She has a normal mood and affect. Her behavior is normal. Thought content normal.    MAU Course  Procedures  MDM 22 y.o. G1P0 6783w4d  Responding well to IV Reglan and D5LR administered  in MAU No ketones on UA Elevated WBC, treat prophylactically for UTI, urine culture ordered, start meds when able to tolerate PO  Patient Vitals for the past 24 hrs:  BP Temp Temp src Pulse Resp SpO2 Height Weight  10/19/17 1234 121/70 - - 70 - - - -  10/19/17 1233 - 98.7 F (37.1 C) Axillary - 18 - - -  10/19/17 1036 127/79 98.3 F (36.8 C) Oral 79 16 100 %  5\' 6"  (1.676 m) 153 lb 1.9 oz (69.5 kg)     Orders Placed This Encounter  Procedures  . Wet prep, genital  . Culture, OB Urine  . Urinalysis, Routine w reflex microscopic  . Maintain IV access  . Discharge patient   Results for orders placed or performed during the hospital encounter of 10/19/17 (from the past 24 hour(s))  Urinalysis, Routine w reflex microscopic     Status: Abnormal   Collection Time: 10/19/17 10:57 AM  Result Value Ref Range   Color, Urine YELLOW YELLOW   APPearance CLOUDY (A) CLEAR   Specific Gravity, Urine 1.024 1.005 - 1.030   pH 7.0 5.0 - 8.0   Glucose, UA NEGATIVE NEGATIVE mg/dL   Hgb urine dipstick NEGATIVE NEGATIVE   Bilirubin Urine NEGATIVE NEGATIVE   Ketones, ur NEGATIVE NEGATIVE mg/dL   Protein, ur 30 (A) NEGATIVE mg/dL   Nitrite NEGATIVE NEGATIVE   Leukocytes, UA TRACE (A) NEGATIVE   RBC / HPF 0-5 0 - 5 RBC/hpf   WBC, UA 11-20 0 - 5 WBC/hpf   Bacteria, UA RARE (A) NONE SEEN   Squamous Epithelial / LPF 11-20 0 - 5   Mucus PRESENT   Wet prep, genital     Status: Abnormal   Collection Time: 10/19/17 11:07 AM  Result Value Ref Range   Yeast Wet Prep HPF POC NONE SEEN NONE SEEN   Trich, Wet Prep NONE SEEN NONE SEEN   Clue Cells Wet Prep HPF POC NONE SEEN NONE SEEN   WBC, Wet Prep HPF POC MANY (A) NONE SEEN   Sperm NONE SEEN     Meds ordered this encounter  Medications  . dextrose 5 % in lactated ringers infusion  . metoCLOPramide (REGLAN) injection 10 mg  . metoCLOPramide (REGLAN) 10 MG tablet    Sig: Take 1 tablet (10 mg total) by mouth every 6 (six) hours.    Dispense:  30 tablet    Refill:  1    Order Specific Question:   Supervising Provider    Answer:   Reva BoresPRATT, TANYA S [[id number]]  . cephALEXin (KEFLEX) 500 MG capsule    Sig: Take 1 capsule (500 mg total) by mouth 4 (four) times daily for 10 days.    Dispense:  40 capsule    Refill:  0    Order Specific Question:   Supervising Provider    Answer:   Reva BoresPRATT, TANYA S [[id number]]    Assessment  and Plan  --22 y.o. G1P0 at 3383w4d  --Nausea/vomiting in pregnancy, rx for Reglan sent to preferred pharmacy --Discussed revised diet and timing of meals for first trimester --UTI in pregnancy, rx for Keflex sent to preferred pharmacy, urine culture pending --Start Keflex  when able to tolerate PO nutrition, absence of vomiting for 24 hours --Reviewed general obstetric precautions including but not limited to falls, fever, vaginal bleeding, leaking of fluid,    headache not relieved by Tylenol, rest and PO hydration.  --Discharge home in stable condition  New OB appointment with Dr. Debroah Loop scheduled for 10/30/2017  Calvert Cantor, CNM 10/19/2017, 12:40 PM

## 2017-10-19 NOTE — Progress Notes (Addendum)
G1 @ early pregnancy. Pregnancy confirmed with HPT and [place] facility. States they did pregnancy test with both urine and blood.   NV for past week.   Weight 153.12. Pt states loss weight of 6 lbs.   Denies bleeding. Resent BV and didn't finish treatment since making her throwup and last time taken flagyl was over a week.ago.    1057: provider at bs assessing. wetprep done. Specimen label verified with pt.   1121: IV started with D5LR on the pump 1127: reglan provided per order.   1136: provider at bs reassessing and discussing POC and informing pt review of urine results. Pt states feeling much better when asked by provider.  Crackers and gingerale provided. Pt tolerated crackers and drink D/c instructions given with pt understanding.   Provider at bs reassessing and going over dc instructions.

## 2017-10-20 LAB — CULTURE, OB URINE: CULTURE: NO GROWTH

## 2017-10-27 ENCOUNTER — Other Ambulatory Visit: Payer: Self-pay

## 2017-10-27 ENCOUNTER — Inpatient Hospital Stay (HOSPITAL_COMMUNITY)
Admission: AD | Admit: 2017-10-27 | Discharge: 2017-10-27 | Disposition: A | Discharge status: Home / Self Care | Payer: Medicaid Other | Source: Ambulatory Visit | Attending: Obstetrics and Gynecology | Admitting: Obstetrics and Gynecology

## 2017-10-27 ENCOUNTER — Encounter (HOSPITAL_COMMUNITY): Payer: Self-pay | Admitting: *Deleted

## 2017-10-27 DIAGNOSIS — O219 Vomiting of pregnancy, unspecified: Secondary | ICD-10-CM | POA: Diagnosis not present

## 2017-10-27 DIAGNOSIS — Z87891 Personal history of nicotine dependence: Secondary | ICD-10-CM | POA: Insufficient documentation

## 2017-10-27 DIAGNOSIS — N760 Acute vaginitis: Secondary | ICD-10-CM

## 2017-10-27 DIAGNOSIS — R112 Nausea with vomiting, unspecified: Secondary | ICD-10-CM | POA: Diagnosis present

## 2017-10-27 DIAGNOSIS — Z3A08 8 weeks gestation of pregnancy: Secondary | ICD-10-CM | POA: Diagnosis not present

## 2017-10-27 DIAGNOSIS — Q5128 Other doubling of uterus, other specified: Secondary | ICD-10-CM

## 2017-10-27 DIAGNOSIS — Q512 Other doubling of uterus, unspecified: Secondary | ICD-10-CM

## 2017-10-27 DIAGNOSIS — O3680X0 Pregnancy with inconclusive fetal viability, not applicable or unspecified: Secondary | ICD-10-CM

## 2017-10-27 DIAGNOSIS — B9689 Other specified bacterial agents as the cause of diseases classified elsewhere: Secondary | ICD-10-CM

## 2017-10-27 LAB — COMPREHENSIVE METABOLIC PANEL
ALT: 81 U/L — ABNORMAL HIGH (ref 0–44)
AST: 53 U/L — ABNORMAL HIGH (ref 15–41)
Albumin: 4.6 g/dL (ref 3.5–5.0)
Alkaline Phosphatase: 55 U/L (ref 38–126)
Anion gap: 15 (ref 5–15)
BUN: 9 mg/dL (ref 6–20)
CO2: 20 mmol/L — ABNORMAL LOW (ref 22–32)
Calcium: 9.9 mg/dL (ref 8.9–10.3)
Chloride: 98 mmol/L (ref 98–111)
Creatinine, Ser: 0.7 mg/dL (ref 0.44–1.00)
GFR calc Af Amer: >60 mL/min (ref >60)
GFR calc non Af Amer: >60 mL/min (ref >60)
Glucose, Bld: 85 mg/dL (ref 70–99)
Potassium: 3.9 mmol/L (ref 3.5–5.1)
Sodium: 133 mmol/L — ABNORMAL LOW (ref 135–145)
Total Bilirubin: 0.8 mg/dL (ref 0.3–1.2)
Total Protein: 8.3 g/dL — ABNORMAL HIGH (ref 6.5–8.1)

## 2017-10-27 LAB — URINALYSIS, ROUTINE W REFLEX MICROSCOPIC
BILIRUBIN URINE: NEGATIVE
Glucose, UA: NEGATIVE mg/dL
HGB URINE DIPSTICK: NEGATIVE
Ketones, ur: 80 mg/dL — AB
LEUKOCYTES UA: NEGATIVE
Nitrite: NEGATIVE
Protein, ur: 100 mg/dL — AB
Specific Gravity, Urine: 1.038 — ABNORMAL HIGH (ref 1.005–1.030)
pH: 5 (ref 5.0–8.0)

## 2017-10-27 MED ORDER — SODIUM CHLORIDE 0.9 % IV SOLN
8.0000 mg | Freq: Once | INTRAVENOUS | Status: AC
  Administered 2017-10-27: 8 mg via INTRAVENOUS
  Filled 2017-10-27: qty 4

## 2017-10-27 MED ORDER — FAMOTIDINE IN NACL 20-0.9 MG/50ML-% IV SOLN
20.0000 mg | Freq: Once | INTRAVENOUS | Status: AC
  Administered 2017-10-27: 20 mg via INTRAVENOUS
  Filled 2017-10-27: qty 50

## 2017-10-27 MED ORDER — LACTATED RINGERS IV BOLUS
1000.0000 mL | Freq: Once | INTRAVENOUS | Status: AC
  Administered 2017-10-27: 1000 mL via INTRAVENOUS

## 2017-10-27 MED ORDER — M.V.I. ADULT IV INJ
Freq: Once | INTRAVENOUS | Status: AC
  Administered 2017-10-27: 22:00:00 via INTRAVENOUS
  Filled 2017-10-27: qty 1000

## 2017-10-27 MED ORDER — PROMETHAZINE HCL 25 MG PO TABS: 25.0000 mg | ORAL_TABLET | Freq: Four times a day (QID) | ORAL | 2 refills | Status: DC | PRN

## 2017-10-27 MED ORDER — METOCLOPRAMIDE HCL 5 MG/ML IJ SOLN
10.0000 mg | Freq: Once | INTRAMUSCULAR | Status: AC
  Administered 2017-10-27: 10 mg via INTRAVENOUS
  Filled 2017-10-27: qty 2

## 2017-10-27 NOTE — MAU Note (Signed)
Pt reports n/v. Vomiting about 10 times a day. Pt was taking regaln and unisom, but has not taken in the last couple of days because the "unisom was making her feel hung over na d the reglan stopped working". Pt has an appointment in the clinic on Thursday.

## 2017-10-27 NOTE — MAU Provider Note (Signed)
Chief Complaint: Nausea   First Provider Initiated Contact with Patient 10/27/17 2145      SUBJECTIVE HPI: Traci Carney is a 22 y.o. G1P0 at [redacted]w[redacted]d by LMP who presents to maternity admissions reporting nausea and vomiting. She reports emesis has been occurring for a month but has worsened over the past 2 weeks. She is currently taking reglan and unisom for N/V with mild relief. She reports not being able to keep anything down over the past 2-3 days. Reports vomiting 8-10 times in the past 24 hours. She denies abdominal pain, vaginal bleeding, vaginal itching/burning, urinary symptoms, h/a, dizziness, or fever/chills.    No past medical history on file. Past Surgical History:  Procedure Laterality Date  . HERNIA REPAIR     Social History   Socioeconomic History  . Marital status: Single    Spouse name: Not on file  . Number of children: Not on file  . Years of education: Not on file  . Highest education level: Not on file  Occupational History  . Not on file  Social Needs  . Financial resource strain: Not on file  . Food insecurity:    Worry: Not on file    Inability: Not on file  . Transportation needs:    Medical: Not on file    Non-medical: Not on file  Tobacco Use  . Smoking status: Former Games developer  . Smokeless tobacco: Never Used  Substance and Sexual Activity  . Alcohol use: Yes    Comment: occasionally   . Drug use: No  . Sexual activity: Not on file  Lifestyle  . Physical activity:    Days per week: Not on file    Minutes per session: Not on file  . Stress: Not on file  Relationships  . Social connections:    Talks on phone: Not on file    Gets together: Not on file    Attends religious service: Not on file    Active member of club or organization: Not on file    Attends meetings of clubs or organizations: Not on file    Relationship status: Not on file  . Intimate partner violence:    Fear of current or ex partner: Not on file    Emotionally abused: Not on  file    Physically abused: Not on file    Forced sexual activity: Not on file  Other Topics Concern  . Not on file  Social History Narrative  . Not on file   No current facility-administered medications on file prior to encounter.    Current Outpatient Medications on File Prior to Encounter  Medication Sig Dispense Refill  . doxylamine, Sleep, (UNISOM) 25 MG tablet Take 25 mg by mouth at bedtime as needed.    . metoCLOPramide (REGLAN) 10 MG tablet Take 1 tablet (10 mg total) by mouth every 6 (six) hours. 30 tablet 1  . Prenatal Vit-Fe Fumarate-FA (PRENATAL COMPLETE) 14-0.4 MG TABS Take 1 tablet by mouth daily after breakfast. 30 each 2  . cephALEXin (KEFLEX) 500 MG capsule Take 1 capsule (500 mg total) by mouth 4 (four) times daily for 10 days. 40 capsule 0   No Known Allergies  ROS:  Review of Systems  Respiratory: Negative.   Cardiovascular: Negative.   Gastrointestinal: Positive for nausea and vomiting. Negative for abdominal pain, constipation and diarrhea.  Genitourinary: Negative.   Neurological: Negative.    I have reviewed patient's Past Medical Hx, Surgical Hx, Family Hx, Social Hx, medications and allergies.  Physical Exam   Patient Vitals for the past 24 hrs:  BP Temp Pulse Resp SpO2 Weight  10/27/17 2338 120/74 - 64 16 - -  10/27/17 2008 131/83 98.2 F (36.8 C) 85 16 99 % 149 lb 12 oz (67.9 kg)   Constitutional: Well-developed, well-nourished female in no acute distress.  Cardiovascular: normal rate Respiratory: normal effort GI: Abd soft, non-tender. Pos BS x 4 MS: Extremities nontender, no edema, normal ROM Neurologic: Alert and oriented x 4.  GU: Neg CVAT. PELVIC EXAM: deferred  LAB RESULTS Results for orders placed or performed during the hospital encounter of 10/27/17 (from the past 24 hour(s))  Urinalysis, Routine w reflex microscopic     Status: Abnormal   Collection Time: 10/27/17  8:21 PM  Result Value Ref Range   Color, Urine AMBER (A) YELLOW    APPearance CLOUDY (A) CLEAR   Specific Gravity, Urine 1.038 (H) 1.005 - 1.030   pH 5.0 5.0 - 8.0   Glucose, UA NEGATIVE NEGATIVE mg/dL   Hgb urine dipstick NEGATIVE NEGATIVE   Bilirubin Urine NEGATIVE NEGATIVE   Ketones, ur 80 (A) NEGATIVE mg/dL   Protein, ur 161 (A) NEGATIVE mg/dL   Nitrite NEGATIVE NEGATIVE   Leukocytes, UA NEGATIVE NEGATIVE   RBC / HPF 0-5 0 - 5 RBC/hpf   WBC, UA 11-20 0 - 5 WBC/hpf   Bacteria, UA MANY (A) NONE SEEN   Squamous Epithelial / LPF 6-10 0 - 5   Mucus PRESENT   Comprehensive metabolic panel     Status: Abnormal   Collection Time: 10/27/17  9:25 PM  Result Value Ref Range   Sodium 133 (L) 135 - 145 mmol/L   Potassium 3.9 3.5 - 5.1 mmol/L   Chloride 98 98 - 111 mmol/L   CO2 20 (L) 22 - 32 mmol/L   Glucose, Bld 85 70 - 99 mg/dL   BUN 9 6 - 20 mg/dL   Creatinine, Ser 0.96 0.44 - 1.00 mg/dL   Calcium 9.9 8.9 - 04.5 mg/dL   Total Protein 8.3 (H) 6.5 - 8.1 g/dL   Albumin 4.6 3.5 - 5.0 g/dL   AST 53 (H) 15 - 41 U/L   ALT 81 (H) 0 - 44 U/L   Alkaline Phosphatase 55 38 - 126 U/L   Total Bilirubin 0.8 0.3 - 1.2 mg/dL   GFR calc non Af Amer >60 >60 mL/min   GFR calc Af Amer >60 >60 mL/min   Anion gap 15 5 - 15    MAU Management/MDM: Orders Placed This Encounter  Procedures  . Urinalysis, Routine w reflex microscopic  . Comprehensive metabolic panel   UA- large amount of ketones present  CMP- WNL   Meds ordered this encounter  Medications  . dextrose 5 % 1,000 mL with multivitamins adult (MVI -12) 10 mL infusion  . lactated ringers bolus 1,000 mL  . famotidine (PEPCID) IVPB 20 mg premix  . ondansetron (ZOFRAN) 8 mg in sodium chloride 0.9 % 50 mL IVPB  . metoCLOPramide (REGLAN) injection 10 mg  . promethazine (PHENERGAN) 25 MG tablet    Sig: Take 1 tablet (25 mg total) by mouth every 6 (six) hours as needed for nausea or vomiting.    Dispense:  30 tablet    Refill:  2    Order Specific Question:   Supervising Provider    Answer:   [place] Bing [[id number]]   Treatments in MAU included banana bag, pepcid, zofran, and reglan IV. Patient able to tolerate crackers  and soda prior to discharge home. Educated and discussed changes in medication and plan or care- patient reports not taking unisom over the past week, discussed stopping unisom and continuing reglan in the morning prior to breakfast and adding phenergan for use throughout the day. Patient agrees to plan of care and medication changes. Pt discharged. Pt stable at time of discharge.   ASSESSMENT 1. Nausea and vomiting in pregnancy   5. [redacted] weeks gestation of pregnancy     PLAN Discharge home Follow up as scheduled for prenatal care  Return to MAU as needed Rx for phenergan sent to pharmacy of choice    Allergies as of 10/27/2017   No Known Allergies     Medication List    STOP taking these medications   cephALEXin 500 MG capsule Commonly known as:  KEFLEX   doxylamine (Sleep) 25 MG tablet Commonly known as:  UNISOM     TAKE these medications   metoCLOPramide 10 MG tablet Commonly known as:  REGLAN Take 1 tablet (10 mg total) by mouth every 6 (six) hours.   PRENATAL COMPLETE 14-0.4 MG Tabs Take 1 tablet by mouth daily after breakfast.   promethazine 25 MG tablet Commonly known as:  PHENERGAN Take 1 tablet (25 mg total) by mouth every 6 (six) hours as needed for nausea or vomiting.       Steward DroneVeronica Kyana Aicher  Certified Nurse-Midwife 10/28/2017  4:42 AM

## 2017-10-29 NOTE — BH Specialist Note (Signed)
Integrated Behavioral Health Initial Visit  MRN: [id number] Name: Traci Carney  Number of Integrated Behavioral Health Clinician visits:: 1/6 Session Start time: 11:31  Session End time: 11:39 Total time: 8 minutes  Type of Service: Integrated Behavioral Health- Individual/Family Interpretor:No. Interpretor Name and Language: n/a   Warm Hand Off Completed.       SUBJECTIVE: Traci Carney is a 22 y.o. female accompanied by n/a Patient was referred by Scheryl DarterJames Arnold, MD for initial OB introduction to integrated behavior health services. Patient reports the following symptoms/concerns: Pt states her only concern today is morning sickness; wants reassurance that this symptom will end soon.  Duration of problem: Current pregnancy; Severity of problem: mild  OBJECTIVE: Mood: Appropriate and Affect: Appropriate Risk of harm to self or others: No plan to harm self or others  LIFE CONTEXT: Family and Social: - School/Work: - Self-Care: - Life Changes: Current pregnancy   GOALS ADDRESSED: n/a  INTERVENTIONS:  Standardized Assessments completed: GAD-7 and PHQ 9  ASSESSMENT: Patient currently experiencing Supervision of low-risk first  Pregnancy, antepartum   Patient may benefit from initial OB introduction to integrated behavior health services.  PLAN: 1. Follow up with behavioral health clinician on : As needed  2. Referral(s): Integrated Behavioral Health Services (In Clinic) 3. "From scale of 1-10, how likely are you to follow plan?": -  Rae LipsJamie C McMannes, LCSW  Dep[facility] screen [facility] 10/30/2017  Decreased Interest 1  Down, Depressed, Hopeless 0  PHQ - 2 Score 1  Altered sleeping 0  Tired, decreased energy 1  Change in appetite 1  Feeling bad or failure about yourself  0  Trouble concentrating 0  Moving slowly or fidgety/restless 0  Suicidal thoughts 0  PHQ-9 Score 3   GAD 7 : Generalized Anxiety Score 10/30/2017  Nervous, Anxious, on Edge 0  Control/stop  worrying 0  Worry too much - different things 0  Trouble relaxing 0  Restless 0  Easily annoyed or irritable 1  Afraid - awful might happen 0  Total GAD 7 Score 1

## 2017-10-30 ENCOUNTER — Ambulatory Visit: Payer: Self-pay | Admitting: Clinical

## 2017-10-30 ENCOUNTER — Encounter: Payer: Self-pay | Admitting: Obstetrics & Gynecology

## 2017-10-30 ENCOUNTER — Other Ambulatory Visit (HOSPITAL_COMMUNITY)
Admission: RE | Admit: 2017-10-30 | Discharge: 2017-10-30 | Disposition: A | Discharge status: Home / Self Care | Payer: Medicaid Other | Source: Ambulatory Visit | Attending: Obstetrics & Gynecology | Admitting: Obstetrics & Gynecology

## 2017-10-30 ENCOUNTER — Ambulatory Visit (INDEPENDENT_AMBULATORY_CARE_PROVIDER_SITE_OTHER): Payer: Medicaid Other | Admitting: Obstetrics & Gynecology

## 2017-10-30 DIAGNOSIS — Z3401 Encounter for supervision of normal first pregnancy, first trimester: Secondary | ICD-10-CM | POA: Insufficient documentation

## 2017-10-30 DIAGNOSIS — Z3A09 9 weeks gestation of pregnancy: Secondary | ICD-10-CM | POA: Diagnosis not present

## 2017-10-30 DIAGNOSIS — Z3400 Encounter for supervision of normal first pregnancy, unspecified trimester: Secondary | ICD-10-CM

## 2017-10-30 LAB — POCT URINALYSIS DIP (DEVICE)
Glucose, UA: NEGATIVE mg/dL
Hgb urine dipstick: NEGATIVE
Ketones, ur: 15 mg/dL — AB
NITRITE: NEGATIVE
PH: 6.5 (ref 5.0–8.0)
PROTEIN: 30 mg/dL — AB
Specific Gravity, Urine: 1.02 (ref 1.005–1.030)
Urobilinogen, UA: 1 mg/dL (ref 0.0–1.0)

## 2017-10-30 NOTE — Patient Instructions (Signed)
First Trimester of Pregnancy The first trimester of pregnancy is from week 1 until the end of week 13 (months 1 through 3). A week after a sperm fertilizes an egg, the egg will implant on the wall of the uterus. This embryo will begin to develop into a baby. Genes from you and your partner will form the baby. The female genes will determine whether the baby will be a boy or a girl. At 6-8 weeks, the eyes and face will be formed, and the heartbeat can be seen on ultrasound. At the end of 12 weeks, all the baby's organs will be formed. Now that you are pregnant, you will want to do everything you can to have a healthy baby. Two of the most important things are to get good prenatal care and to follow your health care provider's instructions. Prenatal care is all the medical care you receive before the baby's birth. This care will help prevent, find, and treat any problems during the pregnancy and childbirth. Body changes during your first trimester Your body goes through many changes during pregnancy. The changes vary from woman to woman.  You may gain or lose a couple of pounds at first.  You may feel sick to your stomach (nauseous) and you may throw up (vomit). If the vomiting is uncontrollable, call your health care provider.  You may tire easily.  You may develop headaches that can be relieved by medicines. All medicines should be approved by your health care provider.  You may urinate more often. Painful urination may mean you have a bladder infection.  You may develop heartburn as a result of your pregnancy.  You may develop constipation because certain hormones are causing the muscles that push stool through your intestines to slow down.  You may develop hemorrhoids or swollen veins (varicose veins).  Your breasts may begin to grow larger and become tender. Your nipples may stick out more, and the tissue that surrounds them (areola) may become darker.  Your gums may bleed and may be  sensitive to brushing and flossing.  Dark spots or blotches (chloasma, mask of pregnancy) may develop on your face. This will likely fade after the baby is born.  Your menstrual periods will stop.  You may have a loss of appetite.  You may develop cravings for certain kinds of food.  You may have changes in your emotions from day to day, such as being excited to be pregnant or being concerned that something may go wrong with the pregnancy and baby.  You may have more vivid and strange dreams.  You may have changes in your hair. These can include thickening of your hair, rapid growth, and changes in texture. Some women also have hair loss during or after pregnancy, or hair that feels dry or thin. Your hair will most likely return to normal after your baby is born.  What to expect at prenatal visits During a routine prenatal visit:  You will be weighed to make sure you and the baby are growing normally.  Your blood pressure will be taken.  Your abdomen will be measured to track your baby's growth.  The fetal heartbeat will be listened to between weeks 10 and 14 of your pregnancy.  Test results from any previous visits will be discussed.  Your health care provider may ask you:  How you are feeling.  If you are feeling the baby move.  If you have had any abnormal symptoms, such as leaking fluid, bleeding, severe headaches,   or abdominal cramping.  If you are using any tobacco products, including cigarettes, chewing tobacco, and electronic cigarettes.  If you have any questions.  Other tests that may be performed during your first trimester include:  Blood tests to find your blood type and to check for the presence of any previous infections. The tests will also be used to check for low iron levels (anemia) and protein on red blood cells (Rh antibodies). Depending on your risk factors, or if you previously had diabetes during pregnancy, you may have tests to check for high blood  sugar that affects pregnant women (gestational diabetes).  Urine tests to check for infections, diabetes, or protein in the urine.  An ultrasound to confirm the proper growth and development of the baby.  Fetal screens for spinal cord problems (spina bifida) and Down syndrome.  HIV (human immunodeficiency virus) testing. Routine prenatal testing includes screening for HIV, unless you choose not to have this test.  You may need other tests to make sure you and the baby are doing well.  Follow these instructions at home: Medicines  Follow your health care provider's instructions regarding medicine use. Specific medicines may be either safe or unsafe to take during pregnancy.  Take a prenatal vitamin that contains at least 600 micrograms (mcg) of folic acid.  If you develop constipation, try taking a stool softener if your health care provider approves. Eating and drinking  Eat a balanced diet that includes fresh fruits and vegetables, whole grains, good sources of protein such as meat, eggs, or tofu, and low-fat dairy. Your health care provider will help you determine the amount of weight gain that is right for you.  Avoid raw meat and uncooked cheese. These carry germs that can cause birth defects in the baby.  Eating four or five small meals rather than three large meals a day may help relieve nausea and vomiting. If you start to feel nauseous, eating a few soda crackers can be helpful. Drinking liquids between meals, instead of during meals, also seems to help ease nausea and vomiting.  Limit foods that are high in fat and processed sugars, such as fried and sweet foods.  To prevent constipation: ? Eat foods that are high in fiber, such as fresh fruits and vegetables, whole grains, and beans. ? Drink enough fluid to keep your urine clear or pale yellow. Activity  Exercise only as directed by your health care provider. Most women can continue their usual exercise routine during  pregnancy. Try to exercise for 30 minutes at least 5 days a week. Exercising will help you: ? Control your weight. ? Stay in shape. ? Be prepared for labor and delivery.  Experiencing pain or cramping in the lower abdomen or lower back is a good sign that you should stop exercising. Check with your health care provider before continuing with normal exercises.  Try to avoid standing for long periods of time. Move your legs often if you must stand in one place for a long time.  Avoid heavy lifting.  Wear low-heeled shoes and practice good posture.  You may continue to have sex unless your health care provider tells you not to. Relieving pain and discomfort  Wear a good support bra to relieve breast tenderness.  Take warm sitz baths to soothe any pain or discomfort caused by hemorrhoids. Use hemorrhoid cream if your health care provider approves.  Rest with your legs elevated if you have leg cramps or low back pain.  If you develop   varicose veins in your legs, wear support hose. Elevate your feet for 15 minutes, 3-4 times a day. Limit salt in your diet. Prenatal care  Schedule your prenatal visits by the twelfth week of pregnancy. They are usually scheduled monthly at first, then more often in the last 2 months before delivery.  Write down your questions. Take them to your prenatal visits.  Keep all your prenatal visits as told by your health care provider. This is important. Safety  Wear your seat belt at all times when driving.  Make a list of emergency phone numbers, including numbers for family, friends, the hospital, and police and fire departments. General instructions  Ask your health care provider for a referral to a local prenatal education class. Begin classes no later than the beginning of month 6 of your pregnancy.  Ask for help if you have counseling or nutritional needs during pregnancy. Your health care provider can offer advice or refer you to specialists for help  with various needs.  Do not use hot tubs, steam rooms, or saunas.  Do not douche or use tampons or scented sanitary pads.  Do not cross your legs for long periods of time.  Avoid cat litter boxes and soil used by cats. These carry germs that can cause birth defects in the baby and possibly loss of the fetus by miscarriage or stillbirth.  Avoid all smoking, herbs, alcohol, and medicines not prescribed by your health care provider. Chemicals in these products affect the formation and growth of the baby.  Do not use any products that contain nicotine or tobacco, such as cigarettes and e-cigarettes. If you need help quitting, ask your health care provider. You may receive counseling support and other resources to help you quit.  Schedule a dentist appointment. At home, brush your teeth with a soft toothbrush and be gentle when you floss. Contact a health care provider if:  You have dizziness.  You have mild pelvic cramps, pelvic pressure, or nagging pain in the abdominal area.  You have persistent nausea, vomiting, or diarrhea.  You have a bad smelling vaginal discharge.  You have pain when you urinate.  You notice increased swelling in your face, hands, legs, or ankles.  You are exposed to fifth disease or chickenpox.  You are exposed to German measles (rubella) and have never had it. Get help right away if:  You have a fever.  You are leaking fluid from your vagina.  You have spotting or bleeding from your vagina.  You have severe abdominal cramping or pain.  You have rapid weight gain or loss.  You vomit blood or material that looks like coffee grounds.  You develop a severe headache.  You have shortness of breath.  You have any kind of trauma, such as from a fall or a car accident. Summary  The first trimester of pregnancy is from week 1 until the end of week 13 (months 1 through 3).  Your body goes through many changes during pregnancy. The changes vary from  woman to woman.  You will have routine prenatal visits. During those visits, your health care provider will examine you, discuss any test results you may have, and talk with you about how you are feeling. This information is not intended to replace advice given to you by your health care provider. Make sure you discuss any questions you have with your health care provider. Document Released: 03/12/2001 Document Revised: 02/28/2016 Document Reviewed: 02/28/2016 Elsevier Interactive Patient Education  2018 Elsevier   Inc[employer]  

## 2017-10-30 NOTE — Progress Notes (Signed)
  Subjective:    Traci Carney is a G1P0 6977w1d being seen today for her first obstetrical visit.  Her obstetrical history is significant for nausea and vomiting with MAU visits and medical management. Patient does intend to breast feed. Pregnancy history fully reviewed.  Patient reports nausea, vomiting and is improved.  Vitals:   10/30/17 1014  BP: 129/88  Pulse: 68  Weight: 153 lb 14.4 oz (69.8 kg)    HISTORY: OB History  Gravida Para Term Preterm AB Living  1            SAB TAB Ectopic Multiple Live Births               # Outcome Date GA Lbr Len/2nd Weight Sex Delivery Anes PTL Lv  1 Current            History reviewed. No pertinent past medical history. Past Surgical History:  Procedure Laterality Date  . HERNIA REPAIR     Family History  Problem Relation Age of Onset  . Hypertension Mother      Exam    Uterus:     Pelvic Exam:    Perineum: No Hemorrhoids   Vulva: normal   Vagina:  normal mucosa   pH:     Cervix: no lesions   Adnexa: normal adnexa   Bony Pelvis: average  System: Breast:  normal appearance, no masses or tenderness, bilateral nipple rings   Skin: normal coloration and turgor, no rashes    Neurologic: oriented, normal mood   Extremities: normal strength, tone, and muscle mass   HEENT PERRLA, sclera clear, anicteric and thyroid without masses   Mouth/Teeth mucous membranes moist, pharynx normal without lesions and dental hygiene good   Neck supple   Cardiovascular: regular rate and rhythm   Respiratory:  appears well, vitals normal, no respiratory distress, acyanotic, normal RR   Abdomen: soft, non-tender; bowel sounds normal; no masses,  no organomegaly   Urinary: urethral meatus normal   Bedside portable US single IUP FHM 160   Assessment:    Pregnancy: G1P0 Patient Active Problem List   Diagnosis Date Noted  . Supervision of low-risk first pregnancy 10/30/2017  . 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 Pregnancy of unknown anatomic  location 10/01/2017  . Bacterial vaginitis 10/01/2017        Plan:    Initial labs drawn. Prenatal vitamins. Problem list reviewed and updated. Genetic Screening discussed and is interested in Panorama  may be done after 10 weeks.  Ultrasound discussed; fetal survey: 18+ weeks.  Follow up in 4 weeks. 50 % of 30 min visit spent on counseling and coordination of care.  Continue nausea medications   Scheryl DarterJames Arnold 10/30/2017

## 2017-11-02 LAB — URINE CULTURE, OB REFLEX

## 2017-11-02 LAB — CULTURE, OB URINE

## 2017-11-03 LAB — CYTOLOGY - PAP
Chlamydia: NEGATIVE
Diagnosis: NEGATIVE
Neisseria Gonorrhea: NEGATIVE

## 2017-11-07 LAB — HEMOGLOBINOPATHY EVALUATION
Ferritin: 73 ng/mL (ref 15–150)
HGB C: 0 %
HGB S: 0 %
HGB SOLUBILITY: NEGATIVE
Hgb A2 Quant: 2.2 % (ref 1.8–3.2)
Hgb A: 97.8 % (ref 96.4–98.8)
Hgb F Quant: 0 % (ref 0.0–2.0)
Hgb Variant: 0 %

## 2017-11-07 LAB — OBSTETRIC PANEL, INCLUDING HIV
ANTIBODY SCREEN: NEGATIVE
Basophils Absolute: 0 10*3/uL (ref 0.0–0.2)
Basos: 0 %
EOS (ABSOLUTE): 0 10*3/uL (ref 0.0–0.4)
EOS: 1 %
HEMOGLOBIN: 12.9 g/dL (ref 11.1–15.9)
HIV Screen 4th Generation wRfx: NONREACTIVE
Hematocrit: 40.1 % (ref 34.0–46.6)
Hepatitis B Surface Ag: NEGATIVE
IMMATURE GRANULOCYTES: 0 %
Immature Grans (Abs): 0 10*3/uL (ref 0.0–0.1)
LYMPHS ABS: 2.1 10*3/uL (ref 0.7–3.1)
Lymphs: 35 %
MCH: 26.4 pg — AB (ref 26.6–33.0)
MCHC: 32.2 g/dL (ref 31.5–35.7)
MCV: 82 fL (ref 79–97)
MONOS ABS: 0.4 10*3/uL (ref 0.1–0.9)
Monocytes: 7 %
Neutrophils Absolute: 3.4 10*3/uL (ref 1.4–7.0)
Neutrophils: 57 %
Platelets: 274 10*3/uL (ref 150–450)
RBC: 4.89 x10E6/uL (ref 3.77–5.28)
RDW: 14.3 % (ref 12.3–15.4)
RH TYPE: POSITIVE
RPR Ser Ql: NONREACTIVE
Rubella Antibodies, IGG: 2.26 index (ref >0.99)
WBC: 6 10*3/uL (ref 3.4–10.8)

## 2017-11-07 LAB — SMN1 COPY NUMBER ANALYSIS (SMA CARRIER SCREENING)

## 2017-11-07 LAB — CYSTIC FIBROSIS GENE TEST

## 2017-11-24 ENCOUNTER — Encounter: Payer: Self-pay | Admitting: Student

## 2017-11-27 ENCOUNTER — Encounter: Payer: Self-pay | Admitting: Student

## 2017-11-27 ENCOUNTER — Ambulatory Visit (INDEPENDENT_AMBULATORY_CARE_PROVIDER_SITE_OTHER): Payer: Medicaid Other | Admitting: Student

## 2017-11-27 VITALS — BP 120/81 | HR 73 | Wt 163.6 lb

## 2017-11-27 DIAGNOSIS — O219 Vomiting of pregnancy, unspecified: Secondary | ICD-10-CM

## 2017-11-27 DIAGNOSIS — Z3401 Encounter for supervision of normal first pregnancy, first trimester: Secondary | ICD-10-CM

## 2017-11-27 MED ORDER — PRENATAL COMPLETE 14-0.4 MG PO TABS: 1.0000 | ORAL_TABLET | Freq: Every day | ORAL | 2 refills | Status: DC

## 2017-11-27 MED ORDER — PRENATAL COMPLETE 14-0.4 MG PO TABS: 1.0000 | ORAL_TABLET | Freq: Every day | ORAL | 2 refills | Status: AC

## 2017-11-27 MED ORDER — PROMETHAZINE HCL 25 MG PO TABS: 25.0000 mg | ORAL_TABLET | Freq: Four times a day (QID) | ORAL | 2 refills | Status: DC | PRN

## 2017-11-27 NOTE — Patient Instructions (Addendum)
[email or web address]    Childbirth Education Options: [facility] Department Classes:  Childbirth education classes can help you get ready for a positive parenting experience. You can also meet other expectant parents and get free stuff for your baby. Each class runs for five weeks on the same night and costs $45 for the mother-to-be and her support person. Medicaid covers the cost if you are eligible. Call [phone] to register. [facility] Childbirth Education:  [phone] or [phone] or [email or web address]  Baby & Me Class: Discuss newborn & infant parenting and family adjustment issues with other new mothers in a relaxed environment. Each week brings a new speaker or baby-centered activity. We encourage new mothers to join [place] every Thursday at 11:00am. Babies birth until crawling. No registration or fee. Daddy [employer]: This course offers Dads-to-be the tools and knowledge needed to feel confident on their journey to becoming new fathers. Experienced dads, who have been trained as coaches, teach dads-to-be how to hold, comfort, diaper, swaddle and play with their infant while being able to support the new mom as well. A class for men taught by men. $25/dad Big Brother/Big Sister: Let your children share in the joy of a new brother or sister in this special class designed just for them. Class includes discussion about how families care for babies: swaddling, holding, diapering, safety as well as how they can be helpful in their new role. This class is designed for children ages 39 to 48, but any age is welcome. Please register each child individually. $5/child  Mom Talk: This mom-led group offers support and connection to mothers as they journey through the adjustments and struggles of that sometimes overwhelming first year after the birth of a child. Tuesdays at 10:00am and Thursdays at 6:00pm. Babies welcome. No registration or fee. Breastfeeding Support Group:  This group is a mother-to-mother support circle where moms have the opportunity to share their breastfeeding experiences. A Lactation Consultant is present for questions and concerns. Meets each Tuesday at 11:00am. No fee or registration. Breastfeeding Your Baby: Learn what to expect in the first days of breastfeeding your newborn.  This class will help you feel more confident with the skills needed to begin your breastfeeding experience. Many new mothers are concerned about breastfeeding after leaving the hospital. This class will also address the most common fears and challenges about breastfeeding during the first few weeks, months and beyond. (call for fee) Comfort Techniques and Tour: This 2 hour interactive class will provide you the opportunity to learn & practice hands-on techniques that can help relieve some of the discomfort of labor and encourage your baby to rotate toward the best position for birth. You and your partner will be able to try a variety of labor positions with birth balls and rebozos as well as practice breathing, relaxation, and visualization techniques. A tour of the [facility] is included with this class. $20 per registrant and support person Childbirth Class- Weekend Option: This class is a Weekend version of our Birth & Baby series. It is designed for parents who have a difficult time fitting several weeks of classes into their schedule. It covers the care of your newborn and the basics of labor and childbirth. It also includes a [place] [facility] and lunch. The class is held two consecutive days: beginning on Friday evening from 6:30 - 8:30 p.m. and the next day, Saturday from 9 a.m. - 4 p.m. (call for fee) Doren Custard Class: Interested in  a waterbirth?  This informational class will help you discover whether waterbirth is the right fit for you. Education about waterbirth itself, supplies you would need and how to assemble  your support team is what you can expect from this class. Some obstetrical practices require this class in order to pursue a waterbirth. (Not all obstetrical practices offer waterbirth-check with your healthcare provider.) Register only the expectant mom, but you are encouraged to bring your partner to class! Required if planning waterbirth, no fee. Infant/Child CPR: Parents, grandparents, babysitters, and friends learn Cardio-Pulmonary Resuscitation skills for infants and children. You will also learn how to treat both conscious and unconscious choking in infants and children. This Family & Friends program does not offer certification. Register each participant individually to ensure that enough mannequins are available. (Call for fee) Grandparent Love: Expecting a grandbaby? This class is for you! Learn about the latest infant care and safety recommendations and ways to support your own child as he or she transitions into the parenting role. Taught by Registered Nurses who are childbirth instructors, but most importantly...they are grandmothers too! $10/person. Childbirth Class- Natural Childbirth: This series of 5 weekly classes is for expectant parents who want to learn and practice natural methods of coping with the process of labor and childbirth. Relaxation, breathing, massage, visualization, role of the partner, and helpful positioning are highlighted. Participants learn how to be confident in their body's ability to give birth. This class will empower and help parents make informed decisions about their own care. Includes discussion that will help new parents transition into the immediate postpartum period. [facility] is included. We suggest taking this class between 25-32 weeks, but it's only a recommendation. $75 per registrant and one support person or $30 Medicaid. Childbirth Class- 3 week Series: This option of 3 weekly classes helps you and your labor partner  prepare for childbirth. Newborn care, labor & birth, cesarean birth, pain management, and comfort techniques are discussed and a [place] [facility] is included. The class meets at the same time, on the same day of the week for 3 consecutive weeks beginning with the starting date you choose. $60 for registrant and one support person.  Marvelous Multiples: Expecting twins, triplets, or more? This class covers the differences in labor, birth, parenting, and breastfeeding issues that face multiples' parents. NICU tour is included. Led by a Certified Childbirth Educator who is the mother of twins. No fee. Caring for Baby: This class is for expectant and adoptive parents who want to learn and practice the most up-to-date newborn care for their babies. Focus is on birth through the first six weeks of life. Topics include feeding, bathing, diapering, crying, umbilical cord care, circumcision care and safe sleep. Parents learn to recognize symptoms of illness and when to call the pediatrician. Register only the mom-to-be and your partner or support person can plan to come with you! $10 per registrant and support person Childbirth Class- online option: This online class offers you the freedom to complete a Birth and Baby series in the comfort of your own home. The flexibility of this option allows you to review sections at your own pace, at times convenient to you and your support people. It includes additional video information, animations, quizzes, and extended activities. Get organized with helpful eClass tools, checklists, and trackers. Once you register online for the class, you will receive an email within a few days to accept the invitation and begin the  class when the time is right for you. The content will be available to you for 60 days. $60 for 60 days of online access for you and your support people.  Local Doulas: Natural Baby Doulas [email or web address] Tel:  [phone] [email or web address] [employer] [phone] [email or web address] The Labor Hassell Halim  (also do waterbirth tub rental) [phone] [email or web address] [facility] Birth Doula [phone] [email or web address].fi Sacred Rhythms  [phone] [email or web address]/ [employer] (PADA) [email or web address] [place] [facility] Baby  [email or web address] Considering Waterbirth? Guide for patients at [facility] [employer]  Why consider waterbirth?  . Gentle birth for babies . Less pain medicine used in labor . May allow for passive descent/less pushing . May reduce perineal tears  . More mobility and instinctive maternal position changes . Increased maternal relaxation . Reduced blood pressure in labor  Is waterbirth safe? What are the risks of infection, drowning or other complications?  . Infection: o Very low risk (3.7 % for tub vs 4.8% for bed) o 7 in 8000 waterbirths with documented infection o Poorly cleaned equipment most common cause o Slightly lower group B strep transmission rate  . Drowning o Maternal:  - Very low risk   - Related to seizures or fainting o Newborn:  - Very low risk. No evidence of increased risk of respiratory problems in multiple large studies - Physiological protection from breathing under water - Avoid underwater birth if there are any fetal complications - Once baby's head is out of the water, keep it out.  . Birth complication o Some reports of cord trauma, but risk decreased by bringing baby to surface gradually o No evidence of increased risk of shoulder dystocia. Mothers can usually change positions faster in water than in a bed, possibly aiding the maneuvers to free the shoulder.   You must attend a Doren Custard class at [facility]  3rd Wednesday of every month from 7-9pm  [employer] by calling [phone] or online at [email or web address]  Bring [place] the certificate from the class to your prenatal appointment  Meet with a midwife at 36 weeks to see if you can still plan a waterbirth and to sign the consent.   Purchase or rent the following supplies:   Water Birth Pool (Birth Pool in a Box or [place] for instance)  (Tubs start ~$125)  Single-use disposable tub liner designed for your brand of tub  New garden hose labeled "lead-free", "suitable for drinking water",  Electric drain pump to remove water (We recommend 792 gallon per hour or greater pump.)   Separate garden hose to remove the dirty water  Fish net  Bathing suit top (optional)  Long-handled mirror (optional)  Places to purchase or rent supplies  GotWebTools.is for tub purchases and supplies  Waterbirthsolutions.com for tub purchases and supplies  The Labor Ladies (www.[email or web address]) $275 for tub rental/set-up & take down/kit   [employer] (http://www.fleming.com/.htm) Information regarding doulas (labor support) who provide pool rentals  Our practice has a Birth Pool in a Box tub at the hospital that you may borrow on a first-come-first-served basis. It is your responsibility to to set up, clean and break down the tub. We cannot guarantee the availability of this tub in advance. You are responsible for bringing all accessories listed above. If you do not have all necessary supplies you cannot have a waterbirth.    Things that would prevent you from having a waterbirth:  Premature, <37wks  Previous cesarean birth  Presence of thick meconium-stained fluid  Multiple gestation (Twins, triplets, etc.)  Uncontrolled diabetes or gestational diabetes requiring medication  Hypertension requiring medication or diagnosis of pre-eclampsia  Heavy vaginal bleeding  Non-reassuring fetal  heart rate  Active infection (MRSA, etc.). Group B Strep is NOT a contraindication for  waterbirth.  If your labor has to be induced and induction method requires continuous  monitoring of the baby's heart rate  Other risks/issues identified by your obstetrical provider  Please remember that birth is unpredictable. Under certain unforeseeable circumstances your provider may advise against giving birth in the tub. These decisions will be made on a case-by-case basis and with the safety of you and your baby as our highest priority.          Contraception Choices Contraception, also called birth control, refers to methods or devices that prevent pregnancy. Hormonal methods Contraceptive implant A contraceptive implant is a thin, plastic tube that contains a hormone. It is inserted into the upper part of the arm. It can remain in place for up to 3 years. Progestin-only injections Progestin-only injections are injections of progestin, a synthetic form of the hormone progesterone. They are given every 3 months by a health care provider. Birth control pills Birth control pills are pills that contain hormones that prevent pregnancy. They must be taken once a day, preferably at the same time each day. Birth control patch The birth control patch contains hormones that prevent pregnancy. It is placed on the skin and must be changed once a week for three weeks and removed on the fourth week. A prescription is needed to use this method of contraception. Vaginal ring A vaginal ring contains hormones that prevent pregnancy. It is placed in the vagina for three weeks and removed on the fourth week. After that, the process is repeated with a new ring. A prescription is needed to use this method of contraception. Emergency contraceptive Emergency contraceptives prevent pregnancy after unprotected sex. They come in pill form and can be taken up to 5 days after sex. They work best the sooner they are  taken after having sex. Most emergency contraceptives are available without a prescription. This method should not be used as your only form of birth control. Barrier methods Female condom A female condom is a thin sheath that is worn over the penis during sex. Condoms keep sperm from going inside a woman's body. They can be used with a spermicide to increase their effectiveness. They should be disposed after a single use. Female condom A female condom is a soft, loose-fitting sheath that is put into the vagina before sex. The condom keeps sperm from going inside a woman's body. They should be disposed after a single use. Diaphragm A diaphragm is a soft, dome-shaped barrier. It is inserted into the vagina before sex, along with a spermicide. The diaphragm blocks sperm from entering the uterus, and the spermicide kills sperm. A diaphragm should be left in the vagina for 6-8 hours after sex and removed within 24 hours. A diaphragm is prescribed and fitted by a health care provider. A diaphragm should be replaced every 1-2 years, after giving birth, after gaining more than 15 lb (6.8 kg), and after pelvic surgery. Cervical cap A cervical cap is a round, soft latex or plastic cup that fits over the cervix. It is inserted into the vagina before sex, along with spermicide. It blocks sperm from entering the uterus. The cap should be left in place for 6-8 hours after sex and removed within 48 hours. A cervical  cap must be prescribed and fitted by a health care provider. It should be replaced every 2 years. Sponge A sponge is a soft, circular piece of polyurethane foam with spermicide on it. The sponge helps block sperm from entering the uterus, and the spermicide kills sperm. To use it, you make it wet and then insert it into the vagina. It should be inserted before sex, left in for at least 6 hours after sex, and removed and thrown away within 30 hours. Spermicides Spermicides are chemicals that kill or block  sperm from entering the cervix and uterus. They can come as a cream, jelly, suppository, foam, or tablet. A spermicide should be inserted into the vagina with an applicator at least 53-64 minutes before sex to allow time for it to work. The process must be repeated every time you have sex. Spermicides do not require a prescription. Intrauterine contraception Intrauterine device (IUD) An IUD is a T-shaped device that is put in a woman's uterus. There are two types:  Hormone IUD.This type contains progestin, a synthetic form of the hormone progesterone. This type can stay in place for 3-5 years.  Copper IUD.This type is wrapped in copper wire. It can stay in place for 10 years.  Permanent methods of contraception Female tubal ligation In this method, a woman's fallopian tubes are sealed, tied, or blocked during surgery to prevent eggs from traveling to the uterus. Hysteroscopic sterilization In this method, a small, flexible insert is placed into each fallopian tube. The inserts cause scar tissue to form in the fallopian tubes and block them, so sperm cannot reach an egg. The procedure takes about 3 months to be effective. Another form of birth control must be used during those 3 months. Female sterilization This is a procedure to tie off the tubes that carry sperm (vasectomy). After the procedure, the man can still ejaculate fluid (semen). Natural planning methods Natural family planning In this method, a couple does not have sex on days when the woman could become pregnant. Calendar method This means keeping track of the length of each menstrual cycle, identifying the days when pregnancy can happen, and not having sex on those days. Ovulation method In this method, a couple avoids sex during ovulation. Symptothermal method This method involves not having sex during ovulation. The woman typically checks for ovulation by watching changes in her temperature and in the consistency of cervical  mucus. Post-ovulation method In this method, a couple waits to have sex until after ovulation. Summary  Contraception, also called birth control, means methods or devices that prevent pregnancy.  Hormonal methods of contraception include implants, injections, pills, patches, vaginal rings, and emergency contraceptives.  Barrier methods of contraception can include female condoms, female condoms, diaphragms, cervical caps, sponges, and spermicides.  There are two types of IUDs (intrauterine devices). An IUD can be put in a woman's uterus to prevent pregnancy for 3-5 years.  Permanent sterilization can be done through a procedure for males, females, or both.  Natural family planning methods involve not having sex on days when the woman could become pregnant. This information is not intended to replace advice given to you by your health care provider. Make sure you discuss any questions you have with your health care provider. Document Released: 03/18/2005 Document Revised: 04/20/2016 Document Reviewed: 04/20/2016 Elsevier Interactive Patient Education  2018 [employer].

## 2017-12-05 ENCOUNTER — Encounter: Payer: Self-pay | Admitting: *Deleted

## 2017-12-08 ENCOUNTER — Encounter: Payer: Self-pay | Admitting: *Deleted

## 2017-12-26 ENCOUNTER — Ambulatory Visit (INDEPENDENT_AMBULATORY_CARE_PROVIDER_SITE_OTHER): Payer: Medicaid Other

## 2017-12-26 VITALS — BP 122/88 | HR 78 | Wt 169.7 lb

## 2017-12-26 DIAGNOSIS — Z3401 Encounter for supervision of normal first pregnancy, first trimester: Secondary | ICD-10-CM

## 2017-12-26 NOTE — Progress Notes (Signed)
   PRENATAL VISIT NOTE  Subjective:  Traci Carney is a 22 y.o. G1P0 at [redacted]w[redacted]d being seen today for ongoing prenatal care.  She is currently monitored for the following issues for this low-risk pregnancy and has Septate uterus and Supervision of low-risk first pregnancy on their problem list.  Patient reports no complaints.  Contractions: Not present. Vag. Bleeding: None.  Movement: Present. Denies leaking of fluid.   The following portions of the patient's history were reviewed and updated as appropriate: allergies, current medications, past family history, past medical history, past social history, past surgical history and problem list. Problem list updated.  Objective:   Vitals:   12/26/17 1449  BP: 122/88  Pulse: 78  Weight: 169 lb 11.2 oz (77 kg)    Fetal Status: Fetal Heart Rate (bpm): 147   Movement: Present     General:  Alert, oriented and cooperative. Patient is in no acute distress.  Skin: Skin is warm and dry. No rash noted.   Cardiovascular: Normal heart rate noted  Respiratory: Normal respiratory effort, no problems with respiration noted  Abdomen: Soft, gravid, appropriate for gestational age.  Pain/Pressure: Absent     Pelvic: Cervical exam deferred        Extremities: Normal range of motion.  Edema: None  Mental Status: Normal mood and affect. Normal behavior. Normal judgment and thought content.   Assessment and Plan:  Pregnancy: G1P0 at [redacted]w[redacted]d  1. Encounter for supervision of low-risk first pregnancy in first trimester - No complaints. Routine care - Panorama results reviewed - Anatomy scan scheduled on 10/9  Preterm labor symptoms and general obstetric precautions including but not limited to vaginal bleeding, contractions, leaking of fluid and fetal movement were reviewed in detail with the patient. Please refer to After Visit Summary for other counseling recommendations.  Return in about 4 weeks (around 01/23/2018) for Return OB visit.  Future  Appointments  Date Time Provider Department Center  01/07/2018  8:45 AM [facility]-MFC [place] 2 WH-MFCUS M[facility]-US    Rolm Bookbinder, CNM 12/26/17 2:55 PM

## 2017-12-26 NOTE — Patient Instructions (Signed)

## 2018-01-07 ENCOUNTER — Ambulatory Visit (HOSPITAL_COMMUNITY)
Admission: RE | Admit: 2018-01-07 | Discharge: 2018-01-07 | Disposition: A | Discharge status: Home / Self Care | Payer: Medicaid Other | Source: Ambulatory Visit | Attending: Student | Admitting: Student

## 2018-01-07 DIAGNOSIS — Z363 Encounter for antenatal screening for malformations: Secondary | ICD-10-CM | POA: Insufficient documentation

## 2018-01-07 DIAGNOSIS — Z3401 Encounter for supervision of normal first pregnancy, first trimester: Secondary | ICD-10-CM

## 2018-01-07 DIAGNOSIS — Z3A19 19 weeks gestation of pregnancy: Secondary | ICD-10-CM | POA: Insufficient documentation

## 2018-01-23 ENCOUNTER — Ambulatory Visit (INDEPENDENT_AMBULATORY_CARE_PROVIDER_SITE_OTHER): Payer: Medicaid Other | Admitting: Student

## 2018-01-23 ENCOUNTER — Other Ambulatory Visit (HOSPITAL_COMMUNITY)
Admission: RE | Admit: 2018-01-23 | Discharge: 2018-01-23 | Disposition: A | Discharge status: Home / Self Care | Payer: Medicaid Other | Source: Ambulatory Visit | Attending: Student | Admitting: Student

## 2018-01-23 VITALS — BP 135/76 | HR 89 | Wt 175.0 lb

## 2018-01-23 DIAGNOSIS — N898 Other specified noninflammatory disorders of vagina: Secondary | ICD-10-CM | POA: Insufficient documentation

## 2018-01-23 DIAGNOSIS — Z3402 Encounter for supervision of normal first pregnancy, second trimester: Secondary | ICD-10-CM

## 2018-01-23 MED ORDER — TERCONAZOLE 0.8 % VA CREA: 1.0000 | TOPICAL_CREAM | Freq: Every day | VAGINAL | 0 refills | Status: DC

## 2018-01-23 NOTE — Progress Notes (Signed)
   PRENATAL VISIT NOTE  Subjective:  Traci Carney is a 22 y.o. G1P0 at [redacted]w[redacted]d being seen today for ongoing prenatal care.  She is currently monitored for the following issues for this low-risk pregnancy and has Septate uterus and Supervision of low-risk first pregnancy on their problem list.  Patient reports vaginal irritation. Vaginal itching & thin foul smelling discharge for the last 2 days. Feels like she has yeast & BV.  Contractions: Not present. Vag. Bleeding: None.  Movement: Present. Denies leaking of fluid.   The following portions of the patient's history were reviewed and updated as appropriate: allergies, current medications, past family history, past medical history, past social history, past surgical history and problem list. Problem list updated.  Objective:   Vitals:   01/23/18 1245  BP: 135/76  Pulse: 89  Weight: 175 lb (79.4 kg)    Fetal Status: Fetal Heart Rate (bpm): 145 Fundal Height: 21 cm Movement: Present     General:  Alert, oriented and cooperative. Patient is in no acute distress.  Skin: Skin is warm and dry. No rash noted.   Cardiovascular: Normal heart rate noted  Respiratory: Normal respiratory effort, no problems with respiration noted  Abdomen: Soft, gravid, appropriate for gestational age.  Pain/Pressure: Absent     Pelvic: Cervical exam deferred        Extremities: Normal range of motion.  Edema: None  Mental Status: Normal mood and affect. Normal behavior. Normal judgment and thought content.   Assessment and Plan:  Pregnancy: G1P0 at [redacted]w[redacted]d  1. Encounter for supervision of low-risk first pregnancy in second trimester   2. Vaginal discharge  - Cervicovaginal ancillary only( [place]) - terconazole (TERAZOL 3) 0.8 % vaginal cream; Place 1 applicator vaginally at bedtime.  Dispense: 20 g; Refill: 0  Preterm labor symptoms and general obstetric precautions including but not limited to vaginal bleeding, contractions, leaking of fluid and  fetal movement were reviewed in detail with the patient. Please refer to After Visit Summary for other counseling recommendations.  Return in about 4 weeks (around 02/20/2018) for Routine OB.  No future appointments.  Judeth Horn, NP '

## 2018-01-23 NOTE — Patient Instructions (Signed)
Second Trimester of Pregnancy The second trimester is from week 13 through week 28, month 4 through 6. This is often the time in pregnancy that you feel your best. Often times, morning sickness has lessened or quit. You may have more energy, and you may get hungry more often. Your unborn baby (fetus) is growing rapidly. At the end of the sixth month, he or she is about 9 inches long and weighs about 1 pounds. You will likely feel the baby move (quickening) between 18 and 20 weeks of pregnancy.  Research childbirth classes and hospital preregistration at [email or web address]  Follow these instructions at home:  Avoid all smoking, herbs, and alcohol. Avoid drugs not approved by your doctor.  Do not use any tobacco products, including cigarettes, chewing tobacco, and electronic cigarettes. If you need help quitting, ask your doctor. You may get counseling or other support to help you quit.  Only take medicine as told by your doctor. Some medicines are safe and some are not during pregnancy.  Exercise only as told by your doctor. Stop exercising if you start having cramps.  Eat regular, healthy meals.  Wear a good support bra if your breasts are tender.  Do not use hot tubs, steam rooms, or saunas.  Wear your seat belt when driving.  Avoid raw meat, uncooked cheese, and liter boxes and soil used by cats.  Take your prenatal vitamins.  Take 1500-2000 milligrams of calcium daily starting at the 20th week of pregnancy until you deliver your baby.  Try taking medicine that helps you poop (stool softener) as needed, and if your doctor approves. Eat more fiber by eating fresh fruit, vegetables, and whole grains. Drink enough fluids to keep your pee (urine) clear or pale yellow.  Take warm water baths (sitz baths) to soothe pain or discomfort caused by hemorrhoids. Use hemorrhoid cream if your doctor approves.  If you have puffy, bulging veins (varicose veins), wear support hose. Raise  (elevate) your feet for 15 minutes, 3-4 times a day. Limit salt in your diet.  Avoid heavy lifting, wear low heals, and sit up straight.  Rest with your legs raised if you have leg cramps or low back pain.  Visit your dentist if you have not gone during your pregnancy. Use a soft toothbrush to brush your teeth. Be gentle when you floss.  You can have sex (intercourse) unless your doctor tells you not to.  Go to your doctor visits.  Get help if:  You feel dizzy.  You have mild cramps or pressure in your lower belly (abdomen).  You have a nagging pain in your belly area.  You continue to feel sick to your stomach (nauseous), throw up (vomit), or have watery poop (diarrhea).  You have bad smelling fluid coming from your vagina.  You have pain with peeing (urination). Get help right away if:  You have a fever.  You are leaking fluid from your vagina.  You have spotting or bleeding from your vagina.  You have severe belly cramping or pain.  You lose or gain weight rapidly.  You have trouble catching your breath and have chest pain.  You notice sudden or extreme puffiness (swelling) of your face, hands, ankles, feet, or legs.  You have not felt the baby move in over an hour.  You have severe headaches that do not go away with medicine.  You have vision changes. This information is not intended to replace advice given to you by your health care provider. Make   sure you discuss any questions you have with your health care provider. Document Released: 06/12/2009 Document Revised: 08/24/2015 Document Reviewed: 05/19/2012 Elsevier Interactive Patient Education  2017 [employer]    

## 2018-01-27 LAB — CERVICOVAGINAL ANCILLARY ONLY
BACTERIAL VAGINITIS: NEGATIVE
CANDIDA VAGINITIS: NEGATIVE
Chlamydia: NEGATIVE
Neisseria Gonorrhea: NEGATIVE
Trichomonas: NEGATIVE

## 2018-02-14 ENCOUNTER — Encounter (HOSPITAL_COMMUNITY): Payer: Self-pay | Admitting: *Deleted

## 2018-02-14 ENCOUNTER — Inpatient Hospital Stay (HOSPITAL_COMMUNITY)
Admission: AD | Admit: 2018-02-14 | Discharge: 2018-02-14 | Disposition: A | Discharge status: Home / Self Care | Payer: Medicaid Other | Source: Ambulatory Visit | Attending: Obstetrics & Gynecology | Admitting: Obstetrics & Gynecology

## 2018-02-14 DIAGNOSIS — R102 Pelvic and perineal pain: Secondary | ICD-10-CM | POA: Diagnosis present

## 2018-02-14 DIAGNOSIS — N949 Unspecified condition associated with female genital organs and menstrual cycle: Secondary | ICD-10-CM | POA: Diagnosis not present

## 2018-02-14 DIAGNOSIS — O98812 Other maternal infectious and parasitic diseases complicating pregnancy, second trimester: Secondary | ICD-10-CM | POA: Diagnosis not present

## 2018-02-14 DIAGNOSIS — B3731 Acute candidiasis of vulva and vagina: Secondary | ICD-10-CM

## 2018-02-14 DIAGNOSIS — O26892 Other specified pregnancy related conditions, second trimester: Secondary | ICD-10-CM

## 2018-02-14 DIAGNOSIS — B373 Candidiasis of vulva and vagina: Secondary | ICD-10-CM | POA: Diagnosis not present

## 2018-02-14 DIAGNOSIS — Z3A24 24 weeks gestation of pregnancy: Secondary | ICD-10-CM | POA: Diagnosis not present

## 2018-02-14 DIAGNOSIS — N898 Other specified noninflammatory disorders of vagina: Secondary | ICD-10-CM

## 2018-02-14 DIAGNOSIS — Z3689 Encounter for other specified antenatal screening: Secondary | ICD-10-CM

## 2018-02-14 LAB — WET PREP, GENITAL
Clue Cells Wet Prep HPF POC: NONE SEEN
Sperm: NONE SEEN
Trich, Wet Prep: NONE SEEN
Yeast Wet Prep HPF POC: NONE SEEN

## 2018-02-14 LAB — URINALYSIS, ROUTINE W REFLEX MICROSCOPIC
Bilirubin Urine: NEGATIVE
Glucose, UA: NEGATIVE mg/dL
Hgb urine dipstick: NEGATIVE
Ketones, ur: NEGATIVE mg/dL
Nitrite: NEGATIVE
Protein, ur: NEGATIVE mg/dL
Specific Gravity, Urine: 1.017 (ref 1.005–1.030)
pH: 7 (ref 5.0–8.0)

## 2018-02-14 LAB — OB RESULTS CONSOLE GC/CHLAMYDIA: Gonorrhea: NEGATIVE

## 2018-02-14 MED ORDER — TERCONAZOLE 0.8 % VA CREA: 1.0000 | TOPICAL_CREAM | Freq: Every day | VAGINAL | 0 refills | Status: DC

## 2018-02-14 NOTE — MAU Provider Note (Signed)
History     CSN: [id number]  Arrival date and time: 02/14/18 1143   First Provider Initiated Contact with Patient 02/14/18 1247      Chief Complaint  Patient presents with  . vaginal pressure   Traci Carney is a 22 y.o. G1P0 at [redacted]w[redacted]d who presents to MAU with complaints of vaginal pressure. She reports vaginal pressure has been occurring for the past 3 days. Describes the pain as pressure and denies being painful. She denies vaginal bleeding, abdominal pain or urinary symptoms. She reports normal discharge. +FM. She denies complications during this pregnancy.    OB History    Gravida  1   Para      Term      Preterm      AB      Living        SAB      TAB      Ectopic      Multiple      Live Births              History reviewed. No pertinent past medical history.  Past Surgical History:  Procedure Laterality Date  . HERNIA REPAIR      Family History  Problem Relation Age of Onset  . Hypertension Mother     Social History   Tobacco Use  . Smoking status: Former Games developer  . Smokeless tobacco: Never Used  Substance Use Topics  . Alcohol use: Yes    Comment: occasionally   . Drug use: No    Allergies: No Known Allergies  Medications Prior to Admission  Medication Sig Dispense Refill Last Dose  . metoCLOPramide (REGLAN) 10 MG tablet Take 1 tablet (10 mg total) by mouth every 6 (six) hours. (Patient not taking: Reported on 11/27/2017) 30 tablet 1 Not Taking  . Prenatal Vit-Fe Fumarate-FA (PRENATAL COMPLETE) 14-0.4 MG TABS Take 1 tablet by mouth daily after breakfast. 30 each 2 Taking  . promethazine (PHENERGAN) 25 MG tablet Take 1 tablet (25 mg total) by mouth every 6 (six) hours as needed for nausea or vomiting. (Patient not taking: Reported on 12/26/2017) 30 tablet 2 Not Taking  . [DISCONTINUED] terconazole (TERAZOL 3) 0.8 % vaginal cream Place 1 applicator vaginally at bedtime. 20 g 0     Review of Systems  Constitutional: Negative.    Respiratory: Negative.   Cardiovascular: Negative.   Gastrointestinal: Negative.   Genitourinary: Negative for difficulty urinating, dysuria, frequency, hematuria, urgency, vaginal bleeding and vaginal discharge.       Vaginal pressure   Physical Exam   Blood pressure 134/78, pulse 78, temperature 97.8 F (36.6 C), temperature source Oral, resp. rate 17, weight 82.6 kg, last menstrual period 08/27/2017, SpO2 98 %, unknown if currently breastfeeding.  Physical Exam  Nursing note and vitals reviewed. Constitutional: She is oriented to person, place, and time. She appears well-developed and well-nourished. No distress.  Cardiovascular: Normal rate, regular rhythm and normal heart sounds.  Respiratory: Effort normal and breath sounds normal. No respiratory distress. She has no wheezes.  GI: Soft. There is no tenderness. There is no rebound.  Gravid appropriate for gestational age  Genitourinary: Vaginal discharge found.  Genitourinary Comments: Pelvic examination: copious amount of white chunky discharge present around introitus and labia, discharge continued upon insertion of speculum examination.   Neurological: She is alert and oriented to person, place, and time.   Dilation: Closed Effacement (%): Thick Cervical Position: Posterior Exam by:: V Lenis Nettleton CNM   FHR: 140/ moderate/  accelerations present/ no decelerations  Toco: UI   MAU Course  Procedures  MDM Orders Placed This Encounter  Procedures  . Culture, OB Urine  . Wet prep, genital  . Urinalysis, Routine w reflex microscopic   NST appropriate for gestational age  UA - trace leukocytes with increased WBC, urine culture sent  Wet prep- negative, will treat for yeast infection based on physical examination of perineum  GC/C pending   Meds ordered this encounter  Medications  . terconazole (TERAZOL 3) 0.8 % vaginal cream    Sig: Place 1 applicator vaginally at bedtime.    Dispense:  20 g    Refill:  0    Order  Specific Question:   Supervising Provider    Answer:   Adam PhenixARNOLD, JAMES G [phone][[id number]]   Discussed results with patient. Rx for terazol sent to pharmacy of choice. Educated on placement of terazol- patient reports she does not believe she used the medication properly last time. Discussed reasons to return to MAU. Follow up as scheduled. Pt stable at time of discharge.   Assessment and Plan   1. Vulvovaginal candidiasis   2. Pelvic pressure in pregnancy, antepartum, second trimester   3. [redacted] weeks gestation of pregnancy   4. NST (non-stress test) reactive   5. Vaginal discharge    Discharge home  F/u as scheduled  Rx for terazol  Return to MAU as needed  Preterm labor precautions and fetal kick counts   Follow-up Information    [facility]   Specialty:  Obstetrics and Gynecology Why:  Follow up as scheduled for prenatal appointments  Contact information: [address] [place] [phone] [phone]          Allergies as of 02/14/2018   No Known Allergies     Medication List    TAKE these medications   metoCLOPramide 10 MG tablet Commonly known as:  REGLAN Take 1 tablet (10 mg total) by mouth every 6 (six) hours.   PRENATAL COMPLETE 14-0.4 MG Tabs Take 1 tablet by mouth daily after breakfast.   promethazine 25 MG tablet Commonly known as:  PHENERGAN Take 1 tablet (25 mg total) by mouth every 6 (six) hours as needed for nausea or vomiting.   terconazole 0.8 % vaginal cream Commonly known as:  TERAZOL 3 Place 1 applicator vaginally at bedtime.       Sharyon CableVeronica C Joory Gough CNM  02/14/2018, 2:10 PM

## 2018-02-14 NOTE — Discharge Instructions (Signed)

## 2018-02-14 NOTE — MAU Note (Signed)
Traci Carney is a 22 y.o. at 2177w3d here in MAU reporting: +vaginal pressure Onset of complaint: started 3 days ago and has gotten worse. Pain score: denies.  Vitals:   02/14/18 1158  BP: 134/78  Pulse: 78  Resp: 17  Temp: 97.8 F (36.6 C)  SpO2: 98%     +FM Lab orders placed from triage: ua

## 2018-02-15 LAB — CULTURE, OB URINE: Culture: NO GROWTH

## 2018-02-16 LAB — GC/CHLAMYDIA PROBE AMP (~~LOC~~) NOT AT ARMC
Chlamydia: NEGATIVE
Neisseria Gonorrhea: NEGATIVE

## 2018-02-19 ENCOUNTER — Ambulatory Visit (INDEPENDENT_AMBULATORY_CARE_PROVIDER_SITE_OTHER): Payer: Medicaid Other | Admitting: Obstetrics and Gynecology

## 2018-02-19 VITALS — BP 125/78 | HR 79 | Wt 182.0 lb

## 2018-02-19 DIAGNOSIS — Q5128 Other doubling of uterus, other specified: Secondary | ICD-10-CM

## 2018-02-19 DIAGNOSIS — Z3402 Encounter for supervision of normal first pregnancy, second trimester: Secondary | ICD-10-CM

## 2018-02-19 DIAGNOSIS — Q512 Other doubling of uterus, unspecified: Secondary | ICD-10-CM

## 2018-02-19 DIAGNOSIS — Z3A25 25 weeks gestation of pregnancy: Secondary | ICD-10-CM

## 2018-02-19 NOTE — Progress Notes (Signed)
   PRENATAL VISIT NOTE  Subjective:  Traci Carney is a 22 y.o. G1P0 at 7350w1d being seen today for ongoing prenatal care.  She is currently monitored for the following issues for this low-risk pregnancy and has Septate uterus and Supervision of low-risk first pregnancy on their problem list.  Patient reports no complaints.  Contractions: Not present. Vag. Bleeding: None.  Movement: Present. Denies leaking of fluid.   The following portions of the patient's history were reviewed and updated as appropriate: allergies, current medications, past family history, past medical history, past social history, past surgical history and problem list. Problem list updated.  Objective:   Vitals:   02/19/18 1532  BP: 125/78  Pulse: 79  Weight: 182 lb (82.6 kg)    Fetal Status: Fetal Heart Rate (bpm): 146 Fundal Height: 25 cm Movement: Present     General:  Alert, oriented and cooperative. Patient is in no acute distress.  Skin: Skin is warm and dry. No rash noted.   Cardiovascular: Normal heart rate noted  Respiratory: Normal respiratory effort, no problems with respiration noted  Abdomen: Soft, gravid, appropriate for gestational age.  Pain/Pressure: Absent     Pelvic: Cervical exam deferred        Extremities: Normal range of motion.  Edema: Trace  Mental Status: Normal mood and affect. Normal behavior. Normal judgment and thought content.   Assessment and Plan:  Pregnancy: G1P0 at 7850w1d  1. Encounter for supervision of low-risk first pregnancy in second trimester  - Doing well - Peds list given - Discussed birthcontrol   2. Septate uterus  - Reviewed MFM US. Septate uterus noted on 09/2017 US.   There are no diagnoses linked to this encounter. Preterm labor symptoms and general obstetric precautions including but not limited to vaginal bleeding, contractions, leaking of fluid and fetal movement were reviewed in detail with the patient. Please refer to After Visit Summary for other  counseling recommendations.  Return in about 4 weeks (around 03/19/2018) for come fasting for 2 hour gtt.  Future Appointments  Date Time Provider Department Center  03/20/2018  9:30 AM [facility]  03/20/2018 10:15 AM Judeth HornLawrence, Erin, [facility]    Venia CarbonJennifer Margit Batte, NP

## 2018-02-19 NOTE — Patient Instructions (Addendum)
Contraception Choices Contraception, also called birth control, refers to methods or devices that prevent pregnancy. Hormonal methods Contraceptive implant A contraceptive implant is a thin, plastic tube that contains a hormone. It is inserted into the upper part of the arm. It can remain in place for up to 3 years. Progestin-only injections Progestin-only injections are injections of progestin, a synthetic form of the hormone progesterone. They are given every 3 months by a health care provider. Birth control pills Birth control pills are pills that contain hormones that prevent pregnancy. They must be taken once a day, preferably at the same time each day. Birth control patch The birth control patch contains hormones that prevent pregnancy. It is placed on the skin and must be changed once a week for three weeks and removed on the fourth week. A prescription is needed to use this method of contraception. Vaginal ring A vaginal ring contains hormones that prevent pregnancy. It is placed in the vagina for three weeks and removed on the fourth week. After that, the process is repeated with a new ring. A prescription is needed to use this method of contraception. Emergency contraceptive Emergency contraceptives prevent pregnancy after unprotected sex. They come in pill form and can be taken up to 5 days after sex. They work best the sooner they are taken after having sex. Most emergency contraceptives are available without a prescription. This method should not be used as your only form of birth control. Barrier methods Female condom A female condom is a thin sheath that is worn over the penis during sex. Condoms keep sperm from going inside a woman's body. They can be used with a spermicide to increase their effectiveness. They should be disposed after a single use. Female condom A female condom is a soft, loose-fitting sheath that is put into the vagina before sex. The condom keeps sperm from going  inside a woman's body. They should be disposed after a single use. Diaphragm A diaphragm is a soft, dome-shaped barrier. It is inserted into the vagina before sex, along with a spermicide. The diaphragm blocks sperm from entering the uterus, and the spermicide kills sperm. A diaphragm should be left in the vagina for 6-8 hours after sex and removed within 24 hours. A diaphragm is prescribed and fitted by a health care provider. A diaphragm should be replaced every 1-2 years, after giving birth, after gaining more than 15 lb (6.8 kg), and after pelvic surgery. Cervical cap A cervical cap is a round, soft latex or plastic cup that fits over the cervix. It is inserted into the vagina before sex, along with spermicide. It blocks sperm from entering the uterus. The cap should be left in place for 6-8 hours after sex and removed within 48 hours. A cervical cap must be prescribed and fitted by a health care provider. It should be replaced every 2 years. Sponge A sponge is a soft, circular piece of polyurethane foam with spermicide on it. The sponge helps block sperm from entering the uterus, and the spermicide kills sperm. To use it, you make it wet and then insert it into the vagina. It should be inserted before sex, left in for at least 6 hours after sex, and removed and thrown away within 30 hours. Spermicides Spermicides are chemicals that kill or block sperm from entering the cervix and uterus. They can come as a cream, jelly, suppository, foam, or tablet. A spermicide should be inserted into the vagina with an applicator at least 10-15 minutes before   sex to allow time for it to work. The process must be repeated every time you have sex. Spermicides do not require a prescription. Intrauterine contraception Intrauterine device (IUD) An IUD is a T-shaped device that is put in a woman's uterus. There are two types:  Hormone IUD.This type contains progestin, a synthetic form of the hormone progesterone. This  type can stay in place for 3-5 years.  Copper IUD.This type is wrapped in copper wire. It can stay in place for 10 years.  Permanent methods of contraception Female tubal ligation In this method, a woman's fallopian tubes are sealed, tied, or blocked during surgery to prevent eggs from traveling to the uterus. Hysteroscopic sterilization In this method, a small, flexible insert is placed into each fallopian tube. The inserts cause scar tissue to form in the fallopian tubes and block them, so sperm cannot reach an egg. The procedure takes about 3 months to be effective. Another form of birth control must be used during those 3 months. Female sterilization This is a procedure to tie off the tubes that carry sperm (vasectomy). After the procedure, the man can still ejaculate fluid (semen). Natural planning methods Natural family planning In this method, a couple does not have sex on days when the woman could become pregnant. Calendar method This means keeping track of the length of each menstrual cycle, identifying the days when pregnancy can happen, and not having sex on those days. Ovulation method In this method, a couple avoids sex during ovulation. Symptothermal method This method involves not having sex during ovulation. The woman typically checks for ovulation by watching changes in her temperature and in the consistency of cervical mucus. Post-ovulation method In this method, a couple waits to have sex until after ovulation. Summary  Contraception, also called birth control, means methods or devices that prevent pregnancy.  Hormonal methods of contraception include implants, injections, pills, patches, vaginal rings, and emergency contraceptives.  Barrier methods of contraception can include female condoms, female condoms, diaphragms, cervical caps, sponges, and spermicides.  There are two types of IUDs (intrauterine devices). An IUD can be put in a woman's uterus to prevent pregnancy  for 3-5 years.  Permanent sterilization can be done through a procedure for males, females, or both.  Natural family planning methods involve not having sex on days when the woman could become pregnant. This information is not intended to replace advice given to you by your health care provider. Make sure you discuss any questions you have with your health care provider. Document Released: 03/18/2005 Document Revised: 04/20/2016 Document Reviewed: 04/20/2016 Elsevier Interactive Patient Education  2018 [employer]   Places to have your son circumcised:    [facility] [phone]$480 while you are in hospital  [facility] [phone] $244 by 4 wks  [facility] [phone] $175 by 2 wks  [facility] [phone] $250 by 7 days [facility] [phone] $269 by 4 wks  These prices sometimes change but are roughly what you can expect to pay. Please call and confirm pricing.   Circumcision is considered an elective/non-medically necessary procedure. There are many reasons parents decide to have their sons circumsized. During the first year of life circumcised males have a reduced risk of urinary tract infections but after this year the rates between circumcised males and uncircumcised males are the same.  It is safe to have your son circumcised outside of the hospital and the places above perform them regularly.   Deciding about Circumcision in Baby Boys  (Up-to-date The Basics)  What is circumcision?  Circumcision is a surgery that removes the skin that covers the tip of the penis, called the "foreskin" Circumcision is usually done when a boy is between 64 and 87 days old. In the [place], circumcision is common. In some other countries, fewer boys are circumcised. Circumcision is a common tradition in some religions.  Should I  have my baby boy circumcised?  There is no easy answer. Circumcision has some benefits. But it also has risks. After talking with your doctor, you will have to decide for yourself what is right for your family.  What are the benefits of circumcision?  Circumcised boys seem to have slightly lower rates of: ?Urinary tract infections ?Swelling of the opening at the tip of the penis Circumcised men seem to have slightly lower rates of: ?Urinary tract infections ?Swelling of the opening at the tip of the penis ?Penis cancer ?HIV and other infections that you catch during sex ?Cervical cancer in the women they have sex with Even so, in the [place], the risks of these problems are small - even in boys and men who have not been circumcised. Plus, boys and men who are not circumcised can reduce these extra risks by: ?Cleaning their penis well ?Using condoms during sex  What are the risks of circumcision?  Risks include: ?Bleeding or infection from the surgery ?Damage to or amputation of the penis ?A chance that the doctor will cut off too much or not enough of the foreskin ?A chance that sex won't feel as good later in life Only about 1 out of every 200 circumcisions leads to problems. There is also a chance that your health insurance won't pay for circumcision.  How is circumcision done in baby boys?  First, the baby gets medicine for pain relief. This might be a cream on the skin or a shot into the base of the penis. Next, the doctor cleans the baby's penis well. Then he or she uses special tools to cut off the foreskin. Finally, the doctor wraps a bandage (called gauze) around the baby's penis. If you have your baby circumcised, his doctor or nurse will give you instructions on how to care for him after the surgery. It is important that you follow those instructions carefully.   AREA PEDIATRIC/FAMILY PRACTICE PHYSICIANS  [facility] [address] [place], [place]  [address] Phone - [phone]   Fax - [phone]  [facility] [address] [place], [place] [address] Phone - [phone]   Fax - [phone]  [facility] [address] [place], [place]  [address] Phone - [phone]   Fax - [phone]  [facility] [address] [place], [place]  [address] Phone - [phone]   Fax - [phone]  [facility] [address] [place], [place]  [address] Phone - [phone]   Fax - [phone]  [facility] [address] [place], [place]  [address] Phone - [phone]   Fax - [phone]  [facility] [address] [place], [place]  [address] Phone - [phone]   Fax - [phone]  [facility] AT [facility] [address] [place], [address] [place], [place]  [address] Phone - [phone]   Fax - [phone]  [facility] AT [facility] [address] [place], [place]  [address] Phone - [phone]   Fax - [phone] [facility] AT  [place] [address] [place], [place]  [address] Phone - [phone]   Fax - [phone]  [facility] [address] [place], [place]  [address] Phone - [phone]   Fax - [phone]  [facility] [address] [place], [place]  [address] Phone - [phone]   Fax - [phone]  [facility] [address] [place], [place]  [address] Phone - [phone]   Fax - [phone]  [facility] [address], [address] [place], [place]  [address] Phone - [phone]  [facility] [address] [place], [place]  [address] Phone - [phone]   Fax - [phone]  [facility] [address] [place], [place]  [address] Phone - [phone]   Fax - [phone]  [facility] [address] [place], [place]  [address] Phone - [phone]   Fax - [phone]  [facility] [address] [place], [place]  [address] Phone - [phone]   Fax - [phone]   [facility] [address] [place], [place]  [address] Phone - [phone]   Fax - [phone]  [facility] [address] [place], [place]  [address] Phone - [phone]   Fax - [phone]  DAVID RUBIN [address] [place], [place]  [address] Phone - [phone]   Fax - [phone]  [facility] [address] [place], [place]  [address] Phone - [phone]   Fax - [phone]  [facility] Alita Chyle [address] [place], [place]  [address] Phone - [phone]   Fax - [phone] Gerarda Fraction [address] [place], [place]  [address] Phone - [phone]   Fax - [phone]  [facility] [address] [place], [place]  [address] Phone - [phone]   Fax - [phone]  [facility] - [place] [address] [place], [place]  [address] Phone - [phone]   Fax - [phone]  [facility] -  Wyvonne Lenz MD [address] [place] [address] Phone [phone]  Fax [phone]

## 2018-03-04 ENCOUNTER — Encounter: Payer: Self-pay | Admitting: Family Medicine

## 2018-03-05 DIAGNOSIS — Z029 Encounter for administrative examinations, unspecified: Secondary | ICD-10-CM

## 2018-03-19 ENCOUNTER — Other Ambulatory Visit: Payer: Self-pay | Admitting: *Deleted

## 2018-03-19 DIAGNOSIS — Z3400 Encounter for supervision of normal first pregnancy, unspecified trimester: Secondary | ICD-10-CM

## 2018-03-20 ENCOUNTER — Ambulatory Visit (INDEPENDENT_AMBULATORY_CARE_PROVIDER_SITE_OTHER): Payer: Medicaid Other | Admitting: Student

## 2018-03-20 ENCOUNTER — Other Ambulatory Visit: Payer: Medicaid Other

## 2018-03-20 VITALS — BP 128/89 | HR 83 | Wt 187.9 lb

## 2018-03-20 DIAGNOSIS — B373 Candidiasis of vulva and vagina: Secondary | ICD-10-CM

## 2018-03-20 DIAGNOSIS — Z3403 Encounter for supervision of normal first pregnancy, third trimester: Secondary | ICD-10-CM

## 2018-03-20 DIAGNOSIS — Z3400 Encounter for supervision of normal first pregnancy, unspecified trimester: Secondary | ICD-10-CM

## 2018-03-20 DIAGNOSIS — N949 Unspecified condition associated with female genital organs and menstrual cycle: Secondary | ICD-10-CM

## 2018-03-20 DIAGNOSIS — B3731 Acute candidiasis of vulva and vagina: Secondary | ICD-10-CM

## 2018-03-20 MED ORDER — COMFORT FIT MATERNITY SUPP SM MISC: 1.0000 [IU] | Freq: Every day | 0 refills | Status: DC | PRN

## 2018-03-20 MED ORDER — TERCONAZOLE 0.8 % VA CREA: 1.0000 | TOPICAL_CREAM | Freq: Every day | VAGINAL | 0 refills | Status: DC

## 2018-03-20 NOTE — Progress Notes (Signed)
   PRENATAL VISIT NOTE  Subjective:  Traci Carney is a 22 y.o. G1P0 at 4436w2d being seen today for ongoing prenatal care.  She is currently monitored for the following issues for this low-risk pregnancy and has Septate uterus and Supervision of low-risk first pregnancy on their problem list.  Patient reports vaginal irritation and pelvic pain. Was prescribed terazol last month for a yeast infection but didn't pick up rx in time. States symptoms have returned; itching & irritation.  Also reports pelvic pain that is worse while at work.  Contractions: Not present. Vag. Bleeding: None.  Movement: Present. Denies leaking of fluid.   The following portions of the patient's history were reviewed and updated as appropriate: allergies, current medications, past family history, past medical history, past social history, past surgical history and problem list. Problem list updated.  Objective:   Vitals:   03/20/18 0951  BP: 128/89  Pulse: 83  Weight: 187 lb 14.4 oz (85.2 kg)    Fetal Status: Fetal Heart Rate (bpm): 148 Fundal Height: 29 cm Movement: Present     General:  Alert, oriented and cooperative. Patient is in no acute distress.  Skin: Skin is warm and dry. No rash noted.   Cardiovascular: Normal heart rate noted  Respiratory: Normal respiratory effort, no problems with respiration noted  Abdomen: Soft, gravid, appropriate for gestational age.  Pain/Pressure: Present     Pelvic: Cervical exam deferred        Extremities: Normal range of motion.  Edema: Trace  Mental Status: Normal mood and affect. Normal behavior. Normal judgment and thought content.   Assessment and Plan:  Pregnancy: G1P0 at 6036w2d  1. Encounter for supervision of low-risk first pregnancy in third trimester -doing well. 28 wk labs today. Discussed Tdap; will get at next visit  2. Vaginal yeast infection  - terconazole (TERAZOL 3) 0.8 % vaginal cream; Place 1 applicator vaginally at bedtime.  Dispense: 20 g;  Refill: 0  3. Round ligament pain  - Elastic Bandages & Supports (COMFORT FIT MATERNITY SUPP SM) MISC; 1 Units by Does not apply route daily as needed.  Dispense: 1 each; Refill: 0  Preterm labor symptoms and general obstetric precautions including but not limited to vaginal bleeding, contractions, leaking of fluid and fetal movement were reviewed in detail with the patient. Please refer to After Visit Summary for other counseling recommendations.  Return in about 2 weeks (around 04/03/2018) for Routine OB.  No future appointments.  Judeth HornErin Valiant Dills, NP

## 2018-03-20 NOTE — Patient Instructions (Addendum)
PREGNANCY SUPPORT BELT: You are not alone, Seventy-five percent of women have some sort of abdominal or back pain at some point in their pregnancy. Your baby is growing at a fast pace, which means that your whole body is rapidly trying to adjust to the changes. As your uterus grows, your back may start feeling a bit under stress and this can result in back or abdominal pain that can go from mild, and therefore bearable, to severe pains that will not allow you to sit or lay down comfortably, When it comes to dealing with pregnancy-related pains and cramps, some pregnant women usually prefer natural remedies, which the market is filled with nowadays. For example, wearing a pregnancy support belt can help ease and lessen your discomfort and pain. WHAT ARE THE BENEFITS OF WEARING A PREGNANCY SUPPORT BELT? A pregnancy support belt provides support to the lower portion of the belly taking some of the weight of the growing uterus and distributing to the other parts of your body. It is designed make you comfortable and gives you extra support. Over the years, the pregnancy apparel market has been studying the needs and wants of pregnant women and they have come up with the most comfortable pregnancy support belts that woman could ever ask for. In fact, you will no longer have to wear a stretched-out or bulky pregnancy belt that is visible underneath your clothes and makes you feel even more uncomfortable. Nowadays, a pregnancy support belt is made of comfortable and stretchy materials that will not irritate your skin but will actually make you feel at ease and you will not even notice you are wearing it. They are easy to put on and adjust during the day and can be worn at night for additional support.  BENEFITS: . Relives Back pain . Relieves Abdominal Muscle and Leg Pain . Stabilizes the Pelvic Ring . Offers a Cushioned Abdominal Lift Pad . Relieves pressure on the Sciatic Nerve Within Minutes WHERE TO GET  YOUR PREGNANCY BELT: [employer] [phone] @2301  [address] [place], [place] [address]    Places to have your son circumcised:    [facility] [phone] while you are in hospital  [facility] [phone] $244 by 4 wks  [facility] [phone] $175 by 2 wks  [facility] [phone] $250 by 7 days [facility] [phone] $269 by 4 wks  These prices sometimes change but are roughly what you can expect to pay. Please call and confirm pricing.   Circumcision is considered an elective/non-medically necessary procedure. There are many reasons parents decide to have their sons circumsized. During the first year of life circumcised males have a reduced risk of urinary tract infections but after this year the rates between circumcised males and uncircumcised males are the same.  It is safe to have your son circumcised outside of the hospital and the places above perform them regularly.   Deciding about Circumcision in Baby Boys  (Up-to-date The Basics)  What is circumcision?  Circumcision is a surgery that removes the skin that covers the tip of the penis, called the "foreskin" Circumcision is usually done when a boy is between 501 and 7110 days old. In the [place], circumcision is common. In some other countries, fewer boys are circumcised. Circumcision is a common tradition in some religions.  Should I have my baby boy circumcised?  There is no easy answer. Circumcision has some benefits. But it also has risks. After talking with your doctor, you will have to  decide for yourself what is right for your family.  What are the benefits of circumcision?  Circumcised boys seem to have slightly lower rates of: ?Urinary tract infections ?Swelling of the opening at the tip of the penis Circumcised men seem to have  slightly lower rates of: ?Urinary tract infections ?Swelling of the opening at the tip of the penis ?Penis cancer ?HIV and other infections that you catch during sex ?Cervical cancer in the women they have sex with Even so, in the [place], the risks of these problems are small - even in boys and men who have not been circumcised. Plus, boys and men who are not circumcised can reduce these extra risks by: ?Cleaning their penis well ?Using condoms during sex  What are the risks of circumcision?  Risks include: ?Bleeding or infection from the surgery ?Damage to or amputation of the penis ?A chance that the doctor will cut off too much or not enough of the foreskin ?A chance that sex won't feel as good later in life Only about 1 out of every 200 circumcisions leads to problems. There is also a chance that your health insurance won't pay for circumcision.  How is circumcision done in baby boys?  First, the baby gets medicine for pain relief. This might be a cream on the skin or a shot into the base of the penis. Next, the doctor cleans the baby's penis well. Then he or she uses special tools to cut off the foreskin. Finally, the doctor wraps a bandage (called gauze) around the baby's penis. If you have your baby circumcised, his doctor or nurse will give you instructions on how to care for him after the surgery. It is important that you follow those instructions carefully.       AREA PEDIATRIC/FAMILY PRACTICE PHYSICIANS  [facility] [address] [place], [place]  [address] Phone - [phone]   Fax - [phone]  [facility] [address] [place], [place] [address] Phone - [phone]   Fax - [phone]  [facility] [address] [place], [place]  [address] Phone - [phone]   Fax - [phone]  [facility] [address] [place], [place]  [address] Phone - [phone]   Fax -  [phone]  [facility] [address] [place], [place]  [address] Phone - [phone]   Fax - [phone]  [facility] [address] [place], [place]  [address] Phone - [phone]   Fax - [phone]  [facility] [place] [address] [place], [place]  [address] Phone - [phone]   Fax - [phone]  [facility] AT [facility] [address] [place], [address] [place], [place]  [address] Phone - [phone]   Fax - [phone]  [facility] AT [facility] [address] [place], [place]  [address] Phone - [phone]   Fax - [phone] [facility] [place] [address] [place], [place]  [address] Phone - [phone]   Fax - [phone]  [facility] [address] [place], [place]  [address] Phone - [phone]   Fax - [phone]  [facility] [address], [address] [place], [place]  [address] Phone - [phone]   Fax - [phone]  [facility] [place] [address] [place], [place]  [address] Phone - [phone]   Fax -  [phone]  [facility] [address], [address] [place], [place]  [address] Phone - [phone]  [facility] [address] [place], [place]  [address] Phone - [phone]   Fax - [phone]  [facility] [address] [place], [place]  [address] Phone - [phone]   Fax - [phone]  [facility] [address] [place], [place]  [address] Phone - [phone]   Fax - [phone]  [facility] [address] [place], [place]  [address] Phone - [phone]   Fax - [phone]   [facility] [address] [place], [place]  [address] Phone - [phone]   Fax - [phone]  [facility] [address] [place], [place]  [address] Phone - [phone]   Fax - [phone]  DAVID RUBIN [address] [place], [place]  [address] Phone - [phone]   Fax - [phone]  [facility] [address] [place], [place]  [address] Phone - [phone]   Fax - [phone]  [facility] [address] [place], [place]  [address] Phone - [phone]   Fax - [phone] [facility] [place] [address] [place], [place]  [address] Phone - [phone]   Fax - [phone]  [facility] [place] [address] [place], [place]  [address] Phone - [phone]   Fax - [phone]  [facility] [place] [address] [place], [place]  [address] Phone - [phone]   Fax - [phone]  [facility] [place] [facility] [place] Wyvonne Lenzharlene Flemming MD [address] [place] [address] Phone [phone]  Fax [phone]

## 2018-03-21 LAB — GLUCOSE TOLERANCE, 2 HOURS W/ 1HR
GLUCOSE, 1 HOUR: 136 mg/dL (ref 65–179)
GLUCOSE, 2 HOUR: 123 mg/dL (ref 65–152)
Glucose, Fasting: 75 mg/dL (ref 65–91)

## 2018-03-21 LAB — CBC
HEMATOCRIT: 36.4 % (ref 34.0–46.6)
HEMOGLOBIN: 12.2 g/dL (ref 11.1–15.9)
MCH: 26.8 pg (ref 26.6–33.0)
MCHC: 33.5 g/dL (ref 31.5–35.7)
MCV: 80 fL (ref 79–97)
Platelets: 278 10*3/uL (ref 150–450)
RBC: 4.55 x10E6/uL (ref 3.77–5.28)
RDW: 14.3 % (ref 12.3–15.4)
WBC: 7.3 10*3/uL (ref 3.4–10.8)

## 2018-03-21 LAB — RPR: RPR: NONREACTIVE

## 2018-03-21 LAB — HIV ANTIBODY (ROUTINE TESTING W REFLEX): HIV Screen 4th Generation wRfx: NONREACTIVE

## 2018-04-01 NOTE — L&D Delivery Note (Addendum)
Operative Delivery Note At 0046 a viable female was delivered via VAVD.  Presentation: vertex; Position: Occiput,, Anterior; Station: +2.  Verbal consent: obtained from patient.  Risks and benefits discussed in detail.  Risks include, but are not limited to the risks of anesthesia, bleeding, infection, damage to maternal tissues, fetal cephalhematoma.  There is also the risk of inability to effect vaginal delivery of the head, or shoulder dystocia that cannot be resolved by established maneuvers, leading to the need for emergency cesarean section.   Went to delivery room after 4 minute prolonged decel to 60s. Large amounts of clots noted from vagina. Fetal bradycardia not improving with position changes or O2. Dr. Despina Hidden called to bedside. Station low enough for vacuum placement. Vacuum placed and infant delivered after 3 minutes of pushing with one pop off. Total time of fetal bradycardia was 11 minutes. Head delivered OA. No nuchal cord present. Shoulder and body delivered in usual fashion. Cord clamped x 2 at delivery, and cut by family member. Infant taken to warmer where NICU team standing by, noted to have spontaneous cry. Cord blood and pH drawn. Placenta delivered spontaneously with gentle cord traction with expulsion of several clots. Fundus firm with massage and Pitocin. Vagina inspected and found to have right side wall vaginal laceration, which was repaired with 3.0 vicryl with good hemostasis achieved. Placenta sent to pathology due to suspected abruption.   APGAR: 8, 9; weight 2121g  .   Placenta status: spontaneous, intact. Sent to pathology Cord: 3 vessel with the following complications: none.  Cord pH: 7.18.   Anesthesia:  Epidural  Instruments: Mushroom vacuum  Episiotomy:  None  Lacerations:  Right vaginal  Suture Repair: 3.0 vicryl Est. Blood Loss (mL):  272  Mom to postpartum.  Baby to Couplet care / Skin to Skin.  De Hollingshead 05/14/2018, 1:07 AM

## 2018-04-03 ENCOUNTER — Inpatient Hospital Stay (HOSPITAL_COMMUNITY)
Admission: AD | Admit: 2018-04-03 | Discharge: 2018-04-03 | Disposition: A | Discharge status: Home / Self Care | Payer: Medicaid Other | Attending: Obstetrics & Gynecology | Admitting: Obstetrics & Gynecology

## 2018-04-03 ENCOUNTER — Encounter (HOSPITAL_COMMUNITY): Payer: Self-pay | Admitting: Emergency Medicine

## 2018-04-03 ENCOUNTER — Other Ambulatory Visit: Payer: Self-pay

## 2018-04-03 DIAGNOSIS — Z3A31 31 weeks gestation of pregnancy: Secondary | ICD-10-CM | POA: Insufficient documentation

## 2018-04-03 DIAGNOSIS — O26893 Other specified pregnancy related conditions, third trimester: Secondary | ICD-10-CM | POA: Insufficient documentation

## 2018-04-03 DIAGNOSIS — Z87891 Personal history of nicotine dependence: Secondary | ICD-10-CM | POA: Insufficient documentation

## 2018-04-03 DIAGNOSIS — Z0371 Encounter for suspected problem with amniotic cavity and membrane ruled out: Secondary | ICD-10-CM

## 2018-04-03 DIAGNOSIS — R109 Unspecified abdominal pain: Secondary | ICD-10-CM | POA: Diagnosis present

## 2018-04-03 DIAGNOSIS — N898 Other specified noninflammatory disorders of vagina: Secondary | ICD-10-CM | POA: Diagnosis not present

## 2018-04-03 LAB — URINALYSIS, ROUTINE W REFLEX MICROSCOPIC
Bilirubin Urine: NEGATIVE
Glucose, UA: NEGATIVE mg/dL
Hgb urine dipstick: NEGATIVE
Ketones, ur: NEGATIVE mg/dL
Nitrite: NEGATIVE
Protein, ur: NEGATIVE mg/dL
Specific Gravity, Urine: 1.014 (ref 1.005–1.030)
pH: 7 (ref 5.0–8.0)

## 2018-04-03 NOTE — Discharge Instructions (Signed)

## 2018-04-03 NOTE — MAU Note (Signed)
Been having some tightness in her stomach and some pressure since 1030.  ? Fluid around 0800, none since- was clear, ? Pee.

## 2018-04-03 NOTE — MAU Provider Note (Signed)
History     CSN: [id number]  Arrival date and time: 04/03/18 1400   First Provider Initiated Contact with Patient 04/03/18 1459      Chief Complaint  Patient presents with  . Abdominal Pain  . Rupture of Membranes   HPI Traci Carney is a 23 y.o. G1P0 at [redacted]w[redacted]d who presents with vaginal discharge. She states she had sex this morning and then around 0800 had a gush of clear fluid. No continued leaking. She also reports intermittent cramping all day. She has not had any water to drink. Denies vaginal bleeding. Reports normal fetal movement.   OB History    Gravida  1   Para      Term      Preterm      AB      Living        SAB      TAB      Ectopic      Multiple      Live Births              History reviewed. No pertinent past medical history.  Past Surgical History:  Procedure Laterality Date  . HERNIA REPAIR      Family History  Problem Relation Age of Onset  . Hypertension Mother     Social History   Tobacco Use  . Smoking status: Former Games developer  . Smokeless tobacco: Never Used  Substance Use Topics  . Alcohol use: Yes    Comment: occasionally   . Drug use: No    Allergies: No Known Allergies  Medications Prior to Admission  Medication Sig Dispense Refill Last Dose  . Elastic Bandages & Supports (COMFORT FIT MATERNITY SUPP SM) MISC 1 Units by Does not apply route daily as needed. 1 each 0   . Prenatal Vit-Fe Fumarate-FA (PRENATAL COMPLETE) 14-0.4 MG TABS Take 1 tablet by mouth daily after breakfast. 30 each 2 Taking  . terconazole (TERAZOL 3) 0.8 % vaginal cream Place 1 applicator vaginally at bedtime. 20 g 0     Review of Systems  Constitutional: Negative.  Negative for fatigue and fever.  HENT: Negative.   Respiratory: Negative.  Negative for shortness of breath.   Cardiovascular: Negative.  Negative for chest pain.  Gastrointestinal: Positive for abdominal pain. Negative for constipation, diarrhea, nausea and vomiting.   Genitourinary: Positive for vaginal discharge. Negative for dysuria and vaginal bleeding.  Neurological: Negative.  Negative for dizziness and headaches.   Physical Exam   Blood pressure (!) 145/80, pulse 82, temperature 98.7 F (37.1 C), temperature source Oral, resp. rate 18, weight 87.8 kg, last menstrual period 08/27/2017, SpO2 99 %, unknown if currently breastfeeding.  Physical Exam  Nursing note and vitals reviewed. Constitutional: She is oriented to person, place, and time. She appears well-developed and well-nourished. No distress.  HENT:  Head: Normocephalic.  Eyes: Pupils are equal, round, and reactive to light.  Cardiovascular: Normal rate, regular rhythm and normal heart sounds.  Respiratory: Effort normal and breath sounds normal. No respiratory distress.  GI: Soft. Bowel sounds are normal. She exhibits no distension. There is no abdominal tenderness.  Genitourinary:    Genitourinary Comments: Pelvic exam: Cervix pink, visually closed, without lesion, scant white creamy discharge, vaginal walls and external genitalia normal Bimanual exam: Cervix 0/long/high, firm, posterior   Neurological: She is alert and oriented to person, place, and time.  Skin: Skin is warm and dry.  Psychiatric: She has a normal mood and affect. Her behavior is  normal. Judgment and thought content normal.   Fetal Tracing:  Baseline: 135 Variability: moderate Accels: 15x15 Decels: none  Toco: none  Dilation: Closed Effacement (%): Thick Exam by:: C Neilll CNM   MAU Course  Procedures Results for orders placed or performed during the hospital encounter of 04/03/18 (from the past 24 hour(s))  Urinalysis, Routine w reflex microscopic     Status: Abnormal   Collection Time: 04/03/18  2:41 PM  Result Value Ref Range   Color, Urine YELLOW YELLOW   APPearance CLOUDY (A) CLEAR   Specific Gravity, Urine 1.014 1.005 - 1.030   pH 7.0 5.0 - 8.0   Glucose, UA NEGATIVE NEGATIVE mg/dL   Hgb urine  dipstick NEGATIVE NEGATIVE   Bilirubin Urine NEGATIVE NEGATIVE   Ketones, ur NEGATIVE NEGATIVE mg/dL   Protein, ur NEGATIVE NEGATIVE mg/dL   Nitrite NEGATIVE NEGATIVE   Leukocytes, UA TRACE (A) NEGATIVE   RBC / HPF 0-5 0 - 5 RBC/hpf   WBC, UA 11-20 0 - 5 WBC/hpf   Bacteria, UA MANY (A) NONE SEEN   Squamous Epithelial / LPF 11-20 0 - 5   MDM UA Fern-negative  No FFN due to recent intercourse No sign of ROM or preterm labor at this time  Assessment and Plan   1. Encounter for suspected premature rupture of amniotic membranes, with rupture of membranes not found   2. [redacted] weeks gestation of pregnancy    -Discharge home in stable condition -Preterm labor precautions discussed -Patient advised to [facility] with [facility] as scheduled for [facility] care -Patient may return to MAU as needed or if her condition were to change or worsen  Rolm Bookbinder CNM 04/03/2018, 2:59 PM

## 2018-04-07 ENCOUNTER — Encounter: Payer: Self-pay | Admitting: Student

## 2018-04-07 ENCOUNTER — Ambulatory Visit (INDEPENDENT_AMBULATORY_CARE_PROVIDER_SITE_OTHER): Payer: Medicaid Other | Admitting: Student

## 2018-04-07 VITALS — BP 131/85 | HR 80 | Wt 193.2 lb

## 2018-04-07 DIAGNOSIS — Z3403 Encounter for supervision of normal first pregnancy, third trimester: Secondary | ICD-10-CM | POA: Diagnosis not present

## 2018-04-07 DIAGNOSIS — Z23 Encounter for immunization: Secondary | ICD-10-CM | POA: Diagnosis not present

## 2018-04-07 DIAGNOSIS — Z3A31 31 weeks gestation of pregnancy: Secondary | ICD-10-CM

## 2018-04-07 NOTE — Progress Notes (Signed)
   PRENATAL VISIT NOTE  Subjective:  Traci Carney is a 23 y.o. G1P0 at [redacted]w[redacted]d being seen today for ongoing prenatal care.  She is currently monitored for the following issues for this low-risk pregnancy and has Septate uterus and Supervision of low-risk first pregnancy on their problem list.  Patient reports LE edema & nipple discharge.  LE edema worse the last few weeks, specifically when working (seated at desk during day). No interventions.  Remover nipple piercings earlier in the pregnancy. Recently saw some thick white discharge from the piercing hole. Leaking has not continued.   Contractions: Not present. Vag. Bleeding: None.  Movement: Present. Denies leaking of fluid.   The following portions of the patient's history were reviewed and updated as appropriate: allergies, current medications, past family history, past medical history, past social history, past surgical history and problem list. Problem list updated.  Objective:   Vitals:   04/07/18 0928  BP: 131/85  Pulse: 80  Weight: 193 lb 3.2 oz (87.6 kg)    Fetal Status: Fetal Heart Rate (bpm): 147 Fundal Height: 30 cm Movement: Present     General:  Alert, oriented and cooperative. Patient is in no acute distress.  Breast: No masses, erythema, nipple drainage.   Skin: Skin is warm and dry. No rash noted.   Cardiovascular: Normal heart rate noted  Respiratory: Normal respiratory effort, no problems with respiration noted  Abdomen: Soft, gravid, appropriate for gestational age.  Pain/Pressure: Present     Pelvic: Cervical exam deferred        Extremities: Normal range of motion.  Edema: Moderate pitting, indentation subsides rapidly  Mental Status: Normal mood and affect. Normal behavior. Normal judgment and thought content.   Assessment and Plan:  Pregnancy: G1P0 at [redacted]w[redacted]d  1. Encounter for supervision of low-risk first pregnancy in third trimester -Discussed tx of swelling. Increase water intake, decrease sodium,  wear knee high compression socks, elevate LE, take frequent breaks at work and stand at her desk for periods of time.  - Tdap vaccine greater than or equal to 7yo IM  Preterm labor symptoms and general obstetric precautions including but not limited to vaginal bleeding, contractions, leaking of fluid and fetal movement were reviewed in detail with the patient. Please refer to After Visit Summary for other counseling recommendations.  Return in about 2 weeks (around 04/21/2018) for Routine OB.  Future Appointments  Date Time Provider Department Center  04/21/2018  9:35 AM Crisoforo [place], Charlesetta Garibaldi, CNM [facility]    Judeth Horn, NP

## 2018-04-07 NOTE — Patient Instructions (Signed)
Third Trimester of Pregnancy The third trimester is from week 28 through week 40 (months 7 through 9). The third trimester is a time when the unborn baby (fetus) is growing rapidly. At the end of the ninth month, the fetus is about 20 inches in length and weighs 6-10 pounds. Body changes during your third trimester Your body will continue to go through many changes during pregnancy. The changes vary from woman to woman. During the third trimester:  Your weight will continue to increase. You can expect to gain 25-35 pounds (11-16 kg) by the end of the pregnancy.  You may begin to get stretch marks on your hips, abdomen, and breasts.  You may urinate more often because the fetus is moving lower into your pelvis and pressing on your bladder.  You may develop or continue to have heartburn. This is caused by increased hormones that slow down muscles in the digestive tract.  You may develop or continue to have constipation because increased hormones slow digestion and cause the muscles that push waste through your intestines to relax.  You may develop hemorrhoids. These are swollen veins (varicose veins) in the rectum that can itch or be painful.  You may develop swollen, bulging veins (varicose veins) in your legs.  You may have increased body aches in the pelvis, back, or thighs. This is due to weight gain and increased hormones that are relaxing your joints.  You may have changes in your hair. These can include thickening of your hair, rapid growth, and changes in texture. Some women also have hair loss during or after pregnancy, or hair that feels dry or thin. Your hair will most likely return to normal after your baby is born.  Your breasts will continue to grow and they will continue to become tender. A yellow fluid (colostrum) may leak from your breasts. This is the first milk you are producing for your baby.  Your belly button may stick out.  You may notice more swelling in your hands,  face, or ankles.  You may have increased tingling or numbness in your hands, arms, and legs. The skin on your belly may also feel numb.  You may feel short of breath because of your expanding uterus.  You may have more problems sleeping. This can be caused by the size of your belly, increased need to urinate, and an increase in your body's metabolism.  You may notice the fetus "dropping," or moving lower in your abdomen (lightening).  You may have increased vaginal discharge.  You may notice your joints feel loose and you may have pain around your pelvic bone. What to expect at prenatal visits You will have prenatal exams every 2 weeks until week 36. Then you will have weekly prenatal exams. During a routine prenatal visit:  You will be weighed to make sure you and the baby are growing normally.  Your blood pressure will be taken.  Your abdomen will be measured to track your baby's growth.  The fetal heartbeat will be listened to.  Any test results from the previous visit will be discussed.  You may have a cervical check near your due date to see if your cervix has softened or thinned (effaced).  You will be tested for Group B streptococcus. This happens between 35 and 37 weeks. Your health care provider may ask you:  What your birth plan is.  How you are feeling.  If you are feeling the baby move.  If you have had any abnormal   symptoms, such as leaking fluid, bleeding, severe headaches, or abdominal cramping.  If you are using any tobacco products, including cigarettes, chewing tobacco, and electronic cigarettes.  If you have any questions. Other tests or screenings that may be performed during your third trimester include:  Blood tests that check for low iron levels (anemia).  Fetal testing to check the health, activity level, and growth of the fetus. Testing is done if you have certain medical conditions or if there are problems during the pregnancy.  Nonstress test  (NST). This test checks the health of your baby to make sure there are no signs of problems, such as the baby not getting enough oxygen. During this test, a belt is placed around your belly. The baby is made to move, and its heart rate is monitored during movement. What is false labor? False labor is a condition in which you feel small, irregular tightenings of the muscles in the womb (contractions) that usually go away with rest, changing position, or drinking water. These are called Braxton Hicks contractions. Contractions may last for hours, days, or even weeks before true labor sets in. If contractions come at regular intervals, become more frequent, increase in intensity, or become painful, you should see your health care provider. What are the signs of labor?  Abdominal cramps.  Regular contractions that start at 10 minutes apart and become stronger and more frequent with time.  Contractions that start on the top of the uterus and spread down to the lower abdomen and back.  Increased pelvic pressure and dull back pain.  A watery or bloody mucus discharge that comes from the vagina.  Leaking of amniotic fluid. This is also known as your "water breaking." It could be a slow trickle or a gush. Let your health care provider know if it has a color or strange odor. If you have any of these signs, call your health care provider right away, even if it is before your due date. Follow these instructions at home: Medicines  Follow your health care provider's instructions regarding medicine use. Specific medicines may be either safe or unsafe to take during pregnancy.  Take a prenatal vitamin that contains at least 600 micrograms (mcg) of folic acid.  If you develop constipation, try taking a stool softener if your health care provider approves. Eating and drinking   Eat a balanced diet that includes fresh fruits and vegetables, whole grains, good sources of protein such as meat, eggs, or tofu,  and low-fat dairy. Your health care provider will help you determine the amount of weight gain that is right for you.  Avoid raw meat and uncooked cheese. These carry germs that can cause birth defects in the baby.  If you have low calcium intake from food, talk to your health care provider about whether you should take a daily calcium supplement.  Eat four or five small meals rather than three large meals a day.  Limit foods that are high in fat and processed sugars, such as fried and sweet foods.  To prevent constipation: ? Drink enough fluid to keep your urine clear or pale yellow. ? Eat foods that are high in fiber, such as fresh fruits and vegetables, whole grains, and beans. Activity  Exercise only as directed by your health care provider. Most women can continue their usual exercise routine during pregnancy. Try to exercise for 30 minutes at least 5 days a week. Stop exercising if you experience uterine contractions.  Avoid heavy lifting.  Do   not exercise in extreme heat or humidity, or at high altitudes.  Wear low-heel, comfortable shoes.  Practice good posture.  You may continue to have sex unless your health care provider tells you otherwise. Relieving pain and discomfort  Take frequent breaks and rest with your legs elevated if you have leg cramps or low back pain.  Take warm sitz baths to soothe any pain or discomfort caused by hemorrhoids. Use hemorrhoid cream if your health care provider approves.  Wear a good support bra to prevent discomfort from breast tenderness.  If you develop varicose veins: ? Wear support pantyhose or compression stockings as told by your healthcare provider. ? Elevate your feet for 15 minutes, 3-4 times a day. Prenatal care  Write down your questions. Take them to your prenatal visits.  Keep all your prenatal visits as told by your health care provider. This is important. Safety  Wear your seat belt at all times when driving.  Make  a list of emergency phone numbers, including numbers for family, friends, the hospital, and police and fire departments. General instructions  Avoid cat litter boxes and soil used by cats. These carry germs that can cause birth defects in the baby. If you have a cat, ask someone to clean the litter box for you.  Do not travel far distances unless it is absolutely necessary and only with the approval of your health care provider.  Do not use hot tubs, steam rooms, or saunas.  Do not drink alcohol.  Do not use any products that contain nicotine or tobacco, such as cigarettes and e-cigarettes. If you need help quitting, ask your health care provider.  Do not use any medicinal herbs or unprescribed drugs. These chemicals affect the formation and growth of the baby.  Do not douche or use tampons or scented sanitary pads.  Do not cross your legs for long periods of time.  To prepare for the arrival of your baby: ? Take prenatal classes to understand, practice, and ask questions about labor and delivery. ? Make a trial run to the hospital. ? Visit the hospital and tour the maternity area. ? Arrange for maternity or paternity leave through employers. ? Arrange for family and friends to take care of pets while you are in the hospital. ? Purchase a rear-facing car seat and make sure you know how to install it in your car. ? Pack your hospital bag. ? Prepare the baby's nursery. Make sure to remove all pillows and stuffed animals from the baby's crib to prevent suffocation.  Visit your dentist if you have not gone during your pregnancy. Use a soft toothbrush to brush your teeth and be gentle when you floss. Contact a health care provider if:  You are unsure if you are in labor or if your water has broken.  You become dizzy.  You have mild pelvic cramps, pelvic pressure, or nagging pain in your abdominal area.  You have lower back pain.  You have persistent nausea, vomiting, or  diarrhea.  You have an unusual or bad smelling vaginal discharge.  You have pain when you urinate. Get help right away if:  Your water breaks before 37 weeks.  You have regular contractions less than 5 minutes apart before 37 weeks.  You have a fever.  You are leaking fluid from your vagina.  You have spotting or bleeding from your vagina.  You have severe abdominal pain or cramping.  You have rapid weight loss or weight gain.  You have   shortness of breath with chest pain.  You notice sudden or extreme swelling of your face, hands, ankles, feet, or legs.  Your baby makes fewer than 10 movements in 2 hours.  You have severe headaches that do not go away when you take medicine.  You have vision changes. Summary  The third trimester is from week 28 through week 40, months 7 through 9. The third trimester is a time when the unborn baby (fetus) is growing rapidly.  During the third trimester, your discomfort may increase as you and your baby continue to gain weight. You may have abdominal, leg, and back pain, sleeping problems, and an increased need to urinate.  During the third trimester your breasts will keep growing and they will continue to become tender. A yellow fluid (colostrum) may leak from your breasts. This is the first milk you are producing for your baby.  False labor is a condition in which you feel small, irregular tightenings of the muscles in the womb (contractions) that eventually go away. These are called Braxton Hicks contractions. Contractions may last for hours, days, or even weeks before true labor sets in.  Signs of labor can include: abdominal cramps; regular contractions that start at 10 minutes apart and become stronger and more frequent with time; watery or bloody mucus discharge that comes from the vagina; increased pelvic pressure and dull back pain; and leaking of amniotic fluid. This information is not intended to replace advice given to you by your  health care provider. Make sure you discuss any questions you have with your health care provider. Document Released: 03/12/2001 Document Revised: 04/23/2016 Document Reviewed: 04/23/2016 Elsevier Interactive Patient Education  2019 [employer]  

## 2018-04-08 DIAGNOSIS — O9A219 Injury, poisoning and certain other consequences of external causes complicating pregnancy, unspecified trimester: Secondary | ICD-10-CM | POA: Insufficient documentation

## 2018-04-09 ENCOUNTER — Telehealth: Payer: Self-pay | Admitting: Obstetrics and Gynecology

## 2018-04-09 ENCOUNTER — Encounter: Payer: Self-pay | Admitting: Obstetrics and Gynecology

## 2018-04-09 NOTE — Telephone Encounter (Signed)
Called the patient to re-schedule missed appt. I left a message to call our clinic.

## 2018-04-21 ENCOUNTER — Encounter: Payer: Self-pay | Admitting: Student

## 2018-04-21 ENCOUNTER — Ambulatory Visit (INDEPENDENT_AMBULATORY_CARE_PROVIDER_SITE_OTHER): Payer: Medicaid Other | Admitting: Student

## 2018-04-21 VITALS — BP 134/87 | HR 77 | Wt 193.3 lb

## 2018-04-21 DIAGNOSIS — Z3A33 33 weeks gestation of pregnancy: Secondary | ICD-10-CM

## 2018-04-21 DIAGNOSIS — Q512 Other doubling of uterus, unspecified: Secondary | ICD-10-CM

## 2018-04-21 DIAGNOSIS — Q5128 Other doubling of uterus, other specified: Secondary | ICD-10-CM

## 2018-04-21 DIAGNOSIS — Z3403 Encounter for supervision of normal first pregnancy, third trimester: Secondary | ICD-10-CM

## 2018-04-21 NOTE — Patient Instructions (Signed)
Hormonal Contraception Information  Hormonal contraception is a type of birth control that uses hormones to prevent pregnancy. It usually involves a combination of the hormones estrogen and progesterone or only the hormone progesterone. Hormonal contraception works in these ways:   It thickens the mucus in the cervix, making it harder for sperm to enter the uterus.   It changes the lining of the uterus, making it harder for an egg to implant.   It may stop the ovaries from releasing eggs (ovulation). Some women who take hormonal contraceptives that contain only progesterone may continue to ovulate.  Hormonal contraception cannot prevent sexually transmitted infections (STIs). Pregnancy may still occur.  Estrogen and progesterone contraceptives  Contraceptives that use a combination of estrogen and progesterone are available in these forms:   Pill. Pills come in different combinations of hormones. They must be taken at the same time each day. Pills can affect your period, causing you to get your period once every three months or not at all.   Patch. The patch must be worn on the lower abdomen for three weeks and then removed on the fourth.   Vaginal ring. The ring is placed in the vagina and left there for three weeks. It is then removed for one week.  Progesterone contraceptives  Contraceptives that use progesterone only are available in these forms:   Pill. Pills should be taken every day of the cycle.   Intrauterine device (IUD). This device is inserted into the uterus and removed or replaced every five years or sooner.   Implant. Plastic rods are placed under the skin of the upper arm. They are removed or replaced every three years or sooner.   Injection. The injection is given once every 90 days.  What are the side effects?  The side effects of estrogen and progesterone contraceptives include:   Nausea.   Headaches.   Breast tenderness.   Bleeding or spotting between menstrual cycles.   High blood  pressure (rare).   Strokes, heart attacks, or blood clots (rare)  Side effects of progesterone-only contraceptives include:   Nausea.   Headaches.   Breast tenderness.   Unpredictable menstrual bleeding.   High blood pressure (rare).  Talk to your health care provider about what side effects may affect you.  Where to find more information   Ask your health care provider for more information and resources about hormonal contraception.   [place] Department of Health and Human Services Office on Women's Health: www.womenshealth.gov  Questions to ask:   What type of hormonal contraception is right for me?   How long should I plan to use hormonal contraception?   What are the side effects of the hormonal contraception method I choose?   How can I prevent STIs while using hormonal contraception?  Contact a health care provider if:   You start taking hormonal contraceptives and you develop persistent or severe side effects.  Summary   Estrogen and progesterone are hormones used in many forms of birth control.   Talk to your health care provider about what side effects may affect you.   Hormonal contraception cannot prevent sexually transmitted infections (STIs).   Ask your health care provider for more information and resources about hormonal contraception.  This information is not intended to replace advice given to you by your health care provider. Make sure you discuss any questions you have with your health care provider.  Document Released: 04/07/2007 Document Revised: 10/23/2017 Document Reviewed: 02/16/2016  Elsevier Interactive Patient Education    2019 [employer]

## 2018-04-21 NOTE — Assessment & Plan Note (Signed)
-  Order growth [place] at next visit (per MD consult)

## 2018-04-21 NOTE — Progress Notes (Signed)
   PRENATAL VISIT NOTE  Subjective:  Traci Carney is a 23 y.o. G1P0 at [redacted]w[redacted]d being seen today for ongoing prenatal care.  She is currently monitored for the following issues for this low-risk pregnancy and has Septate uterus and Supervision of low-risk first pregnancy on their problem list.  Patient reports lots of fetal movements. Once three days ago she felt that she couldnt' pee because the baby was obstructing her urine. She denies low back pain, burning with urination, discolored urine. .  Contractions: Irritability. Vag. Bleeding: None.  Movement: Present. Denies leaking of fluid.   The following portions of the patient's history were reviewed and updated as appropriate: allergies, current medications, past family history, past medical history, past social history, past surgical history and problem list. Problem list updated.  Objective:   Vitals:   04/21/18 1003  BP: 134/87  Pulse: 77  Weight: 193 lb 4.8 oz (87.7 kg)    Fetal Status: Fetal Heart Rate (bpm): 157 Fundal Height: 33 cm Movement: Present     General:  Alert, oriented and cooperative. Patient is in no acute distress.  Skin: Skin is warm and dry. No rash noted.   Cardiovascular: Normal heart rate noted  Respiratory: Normal respiratory effort, no problems with respiration noted  Abdomen: Soft, gravid, appropriate for gestational age.  Pain/Pressure: Present     Pelvic: Cervical exam deferred        Extremities: Normal range of motion.  Edema: Mild pitting, slight indentation  Mental Status: Normal mood and affect. Normal behavior. Normal judgment and thought content.   Assessment and Plan:  Pregnancy: G1P0 at [redacted]w[redacted]d   1. Encounter for supervision of low-risk first pregnancy in third trimester   2. Septate uterus    -Patient doing well; will give accomodation letter so that patient can have frequent bathroom breaks.  -Address given for pregnancy support belt.  -Discussed birth control; she did not like Depo  (which she has had before). Thinking about pills.  -She has a list of pediatricians; she will look into it.   Preterm labor symptoms and general obstetric precautions including but not limited to vaginal bleeding, contractions, leaking of fluid and fetal movement were reviewed in detail with the patient. Please refer to After Visit Summary for other counseling recommendations.  Return in about 2 weeks (around 05/05/2018), or LROB.  No future appointments.  Marylene Land, CNM

## 2018-05-05 ENCOUNTER — Ambulatory Visit (INDEPENDENT_AMBULATORY_CARE_PROVIDER_SITE_OTHER): Payer: Medicaid Other | Admitting: Student

## 2018-05-05 VITALS — BP 139/87 | HR 81 | Wt 196.1 lb

## 2018-05-05 DIAGNOSIS — Z3403 Encounter for supervision of normal first pregnancy, third trimester: Secondary | ICD-10-CM

## 2018-05-05 DIAGNOSIS — Q512 Other doubling of uterus, unspecified: Secondary | ICD-10-CM

## 2018-05-05 DIAGNOSIS — Q5128 Other doubling of uterus, other specified: Secondary | ICD-10-CM

## 2018-05-05 NOTE — Progress Notes (Signed)
   PRENATAL VISIT NOTE  Subjective:  Traci Carney is a 23 y.o. G1P0 at [redacted]w[redacted]d being seen today for ongoing prenatal care.  She is currently monitored for the following issues for this low-risk pregnancy and has Septate uterus and Supervision of low-risk first pregnancy on their problem list.  Patient reports no complaints. She is feeling better after she got a maternity support belt.  Contractions: Not present. Vag. Bleeding: None.  Movement: Present. Denies leaking of fluid.   The following portions of the patient's history were reviewed and updated as appropriate: allergies, current medications, past family history, past medical history, past social history, past surgical history and problem list. Problem list updated.  Objective:   Vitals:   05/05/18 1038  BP: 139/87  Pulse: 81  Weight: 196 lb 1.6 oz (89 kg)    Fetal Status: Fetal Heart Rate (bpm): 144 Fundal Height: 35 cm Movement: Present     General:  Alert, oriented and cooperative. Patient is in no acute distress.  Skin: Skin is warm and dry. No rash noted.   Cardiovascular: Normal heart rate noted  Respiratory: Normal respiratory effort, no problems with respiration noted  Abdomen: Soft, gravid, appropriate for gestational age.  Pain/Pressure: Present     Pelvic: Cervical exam deferred        Extremities: Normal range of motion.     Mental Status: Normal mood and affect. Normal behavior. Normal judgment and thought content.   Assessment and Plan:  Pregnancy: G1P0 at [redacted]w[redacted]d  1. Septate uterus   2. Encounter for supervision of low-risk first pregnancy in third trimester    - [place] MFM OB FOLLOW UP; Future   Preterm labor symptoms and general obstetric precautions including but not limited to vaginal bleeding, contractions, leaking of fluid and fetal movement were reviewed in detail with the patient. Please refer to After Visit Summary for other counseling recommendations.  Return in about 1 week (around 05/12/2018), or  LROB.  Future Appointments  Date Time Provider Department Center  05/12/2018  1:15 PM [facility] [place] 4 [facility]    Marylene Land, [place]

## 2018-05-05 NOTE — Patient Instructions (Signed)
Tests and Screening During Pregnancy Having certain tests and screenings during pregnancy is an important part of your prenatal care. These tests help your health care provider find problems that might affect your pregnancy. Some tests are done for all pregnant women, and some are optional. Most of the tests and screenings do not pose any risks for you or your baby. You may need additional testing if any routine tests indicate a problem. Tests and screenings done in early pregnancy Some tests and screenings you can expect to have in early pregnancy include:  Blood tests, such as: ? Complete blood count (CBC). This test is done to check your red and white blood cells. It can help identify a risk for anemia, infection, or bleeding. ? Blood typing. This test determines your blood type as well as whether you have a certain protein in your red blood cells (Rh factor). If you do not have this protein (Rh negative) and your baby does have it (Rh positive), your body could make antibodies to the Rh factor. This could be dangerous to your baby's health. ? Tests to check for diseases that can cause birth defects or can be passed to your baby, such as:  [place] measles (rubella). The test indicates whether you are immune to rubella.  Hepatitis B and C. All women are tested for hepatitis B. You may also be tested for hepatitis C if you have risk factors for the condition.  Zika virus infection. You may have a blood or urine test to check for this infection if you or your partner has traveled to an area where the virus occurs.  Urine testing. A urine sample can be tested for diabetes, protein in your urine, and signs of infection.  Testing for sexually transmitted infections (STIs), such as HIV, syphilis, and chlamydia.  Testing for tuberculosis. You may have this skin test if you are at risk for tuberculosis.  Fetal ultrasound. This is an imaging study of your developing baby. It is done using sound waves  and a computer. This test may be done at 11-14 weeks to confirm your pregnancy and help determine your due date. Tests and screenings done later in pregnancy Certain tests are done for the first time during later pregnancy. In addition, some of the tests that were done in early pregnancy are repeated at this time. Some common tests you can expect to have later in pregnancy include:  Rh antibody testing. If you are Rh negative, you will have a blood test at about 28 weeks of pregnancy to see if you are producing Rh antibodies. If you have not started to make antibodies, you will be given an injection to prevent you from making antibodies for the rest of your pregnancy.  Glucose screening. This tests your blood sugar to find out whether you are developing the type of diabetes that occurs during pregnancy (gestational diabetes). You may have this screening earlier if you have risk factors for diabetes.  Screening for group B streptococcus (GBS). GBS is a type of bacteria that may live in your rectum or vagina. You may have GBS without any symptoms. GBS can spread to your baby during birth. This test involves doing a rectal and vaginal swab at 35-37 weeks of pregnancy. If testing is positive for GBS, you may be treated with antibiotic medicine.  CBC to check for anemia and blood-clotting ability.  Urine tests to check for protein, which can be a sign of a condition called preeclampsia.  Fetal ultrasound. This  may be repeated at 16-20 weeks to check how your baby is growing and developing. Screening for birth defects Some birth defects are caused by abnormal genes passed down through families. Early in your pregnancy, tests can be done to find out if your baby is at risk for a genetic disorder. This testing is optional. The type of testing recommended for you will depend on your family and medical history, your ethnicity, and your age. Testing may include:  Screening tests. These tests may include an  ultrasound, blood tests, or a combination of both. The blood tests are used to check for abnormal genes, and the ultrasound is done to look for early birth defects.  Carrier screening. This test involves checking the blood or saliva of both parents to see if they carry abnormal genes that could be passed down to a baby. If genetic screening shows that your baby is at risk for a genetic defect, additional diagnostic testing may be recommended, such as:  Amniocentesis. This involves testing a sample of fluid from your womb (amniotic fluid).  Chorionic villus sampling. In this test, a sample of cells from your placenta is checked for abnormal cells. Unlike other tests done during pregnancy, diagnostic testing does have some risk for your pregnancy. Talk to your health care provider about the risks and benefits of genetic testing. Where to find more information  American Pregnancy Association: americanpregnancy.org/prenatal-testing  Office on Women's Health: womenshealth.gov/pregnancy  March of Dimes: marchofdimes.org/pregnancy Questions to ask your health care provider  What routine tests are recommended for me?  When and how will these tests be done?  When will I get the results of routine tests?  What do the results of these tests mean for me or my baby?  Do you recommend any genetic screening tests? Which ones?  Should I see a genetic counselor before having genetic screening? Summary  Having tests and screenings during pregnancy is an important part of your prenatal care.  In early pregnancy, testing may be done to check blood type, Rh status, and risks for various conditions that can affect your baby.  Fetal ultrasound may be done in early pregnancy to confirm a pregnancy and later to look for any birth defects.  Later in pregnancy, tests may include screening for GBS and gestational diabetes.  Genetic testing is optional. Consider talking to a genetic counselor about this  testing. This information is not intended to replace advice given to you by your health care provider. Make sure you discuss any questions you have with your health care provider. Document Released: 06/02/2017 Document Revised: 06/02/2017 Document Reviewed: 06/02/2017 Elsevier Interactive Patient Education  2019 Elsevier Inc.  

## 2018-05-12 ENCOUNTER — Encounter (HOSPITAL_COMMUNITY): Payer: Self-pay

## 2018-05-12 ENCOUNTER — Ambulatory Visit (HOSPITAL_COMMUNITY)
Admission: RE | Admit: 2018-05-12 | Discharge: 2018-05-12 | Disposition: A | Discharge status: Home / Self Care | Payer: Medicaid Other | Source: Ambulatory Visit | Attending: Student | Admitting: Student

## 2018-05-12 ENCOUNTER — Other Ambulatory Visit (HOSPITAL_COMMUNITY)
Admission: RE | Admit: 2018-05-12 | Discharge: 2018-05-12 | Disposition: A | Discharge status: Home / Self Care | Payer: Medicaid Other | Source: Ambulatory Visit | Attending: Student | Admitting: Student

## 2018-05-12 ENCOUNTER — Other Ambulatory Visit: Payer: Self-pay | Admitting: Family Medicine

## 2018-05-12 ENCOUNTER — Encounter: Payer: Self-pay | Admitting: Student

## 2018-05-12 ENCOUNTER — Ambulatory Visit (INDEPENDENT_AMBULATORY_CARE_PROVIDER_SITE_OTHER): Payer: Medicaid Other | Admitting: Student

## 2018-05-12 ENCOUNTER — Other Ambulatory Visit: Payer: Self-pay | Admitting: Student

## 2018-05-12 ENCOUNTER — Ambulatory Visit (HOSPITAL_BASED_OUTPATIENT_CLINIC_OR_DEPARTMENT_OTHER)
Admission: RE | Admit: 2018-05-12 | Discharge: 2018-05-12 | Disposition: A | Discharge status: Home / Self Care | Payer: Medicaid Other | Source: Ambulatory Visit | Attending: Student | Admitting: Student

## 2018-05-12 ENCOUNTER — Telehealth: Payer: Self-pay | Admitting: Student

## 2018-05-12 VITALS — BP 150/92 | HR 83

## 2018-05-12 VITALS — BP 158/85 | HR 81 | Wt 197.0 lb

## 2018-05-12 DIAGNOSIS — Z3A36 36 weeks gestation of pregnancy: Secondary | ICD-10-CM | POA: Diagnosis not present

## 2018-05-12 DIAGNOSIS — O133 Gestational [pregnancy-induced] hypertension without significant proteinuria, third trimester: Secondary | ICD-10-CM

## 2018-05-12 DIAGNOSIS — Z3403 Encounter for supervision of normal first pregnancy, third trimester: Secondary | ICD-10-CM | POA: Diagnosis present

## 2018-05-12 DIAGNOSIS — Q512 Other doubling of uterus, unspecified: Secondary | ICD-10-CM | POA: Insufficient documentation

## 2018-05-12 DIAGNOSIS — O365930 Maternal care for other known or suspected poor fetal growth, third trimester, not applicable or unspecified: Secondary | ICD-10-CM

## 2018-05-12 DIAGNOSIS — O26843 Uterine size-date discrepancy, third trimester: Secondary | ICD-10-CM

## 2018-05-12 DIAGNOSIS — Q5128 Other doubling of uterus, other specified: Secondary | ICD-10-CM

## 2018-05-12 DIAGNOSIS — O139 Gestational [pregnancy-induced] hypertension without significant proteinuria, unspecified trimester: Secondary | ICD-10-CM | POA: Insufficient documentation

## 2018-05-12 LAB — POCT URINALYSIS DIP (DEVICE)
BILIRUBIN URINE: NEGATIVE
GLUCOSE, UA: NEGATIVE mg/dL
Hgb urine dipstick: NEGATIVE
Ketones, ur: NEGATIVE mg/dL
LEUKOCYTES UA: NEGATIVE
Nitrite: NEGATIVE
Protein, ur: 30 mg/dL — AB
SPECIFIC GRAVITY, URINE: 1.02 (ref 1.005–1.030)
UROBILINOGEN UA: 0.2 mg/dL (ref 0.0–1.0)
pH: 7 (ref 5.0–8.0)

## 2018-05-12 NOTE — Progress Notes (Signed)
   PRENATAL VISIT NOTE  Subjective:  Traci Carney is a 23 y.o. G1P0 at 59w6dbeing seen today for ongoing prenatal care.  She is currently monitored for the following issues for this low-risk pregnancy and has Septate uterus and Supervision of low-risk first pregnancy on their problem list.  Patient reports no complaints. She denies HA, blurry vision, epigastric pain. She has [place] for [place] this afternoon. .  Contractions: Not present. Vag. Bleeding: None.  Movement: Present. Denies leaking of fluid.   The following portions of the patient's history were reviewed and updated as appropriate: allergies, current medications, past family history, past medical history, past social history, past surgical history and problem list. Problem list updated.  Objective:   Vitals:   05/12/18 1005 05/12/18 1044  BP: (!) 148/99 (!) 158/85  Pulse: 81   Weight: 197 lb (89.4 kg)     Fetal Status: Fetal Heart Rate (bpm): 132 Fundal Height: 36 cm Movement: Present  Presentation: Vertex  General:  Alert, oriented and cooperative. Patient is in no acute distress.  Skin: Skin is warm and dry. No rash noted.   Cardiovascular: Normal heart rate noted  Respiratory: Normal respiratory effort, no problems with respiration noted  Abdomen: Soft, gravid, appropriate for gestational age.  Pain/Pressure: Present     Pelvic: Cervical exam performed Dilation: Closed      Extremities: Normal range of motion.     Mental Status: Normal mood and affect. Normal behavior. Normal judgment and thought content.   Assessment and Plan:  Pregnancy: G1P0 at 341w6d1. Encounter for supervision of low-risk first pregnancy in third trimester -Patient had two elevated Blood pressures today; she states that she has been eating a lot of salt. Will draw pre-e labs and have her return to clinic in two days for BP check. If she meets criteria for gestational hypertension will be induced at 37 weeks; will also check pre-e labs.    - Culture, beta strep (group b only) - [facility]Chlamydia [facility] amp [facility] Protein / creatinine ratio, urine - CBC - Comp Met (CMET)  Preterm labor symptoms and general obstetric precautions including but not limited to vaginal bleeding, contractions, leaking of fluid and fetal movement were reviewed in detail with the patient. Please refer to After Visit Summary for other counseling recommendations.  Return in about 1 week (around 05/19/2018), or [facility] in 1 week an BP check on Thursday (2-13) .  Future Appointments  Date Time Provider DeLuis M. Cintron2/01/2019  1:15 PM [facility] [place]

## 2018-05-12 NOTE — Patient Instructions (Signed)
Hypertension During Pregnancy ° °Hypertension is also called high blood pressure. High blood pressure means that the force of your blood moving in your body is too strong. When you are pregnant, this condition should be watched carefully. It can cause problems for you and your baby. °Follow these instructions at home: °Eating and drinking ° °· Drink enough fluid to keep your pee (urine) pale yellow. °· Avoid caffeine. °Lifestyle °· Do not use any products that contain nicotine or tobacco, such as cigarettes and e-cigarettes. If you need help quitting, ask your doctor. °· Do not use alcohol or drugs. °· Avoid stress. °· Rest and get plenty of sleep. °General instructions °· Take over-the-counter and prescription medicines only as told by your doctor. °· While lying down, lie on your left side. This keeps pressure off your major blood vessels. °· While sitting or lying down, raise (elevate) your feet. Try putting some pillows under your lower legs. °· Exercise regularly. Ask your doctor what kinds of exercise are best for you. °· Keep all prenatal and follow-up visits as told by your doctor. This is important. °Contact a doctor if: °· You have symptoms that your doctor told you to watch for, such as: °? Throwing up (vomiting). °? Feeling sick to your stomach (nausea). °? Headache. °Get help right away if you have: °· Very bad belly pain that does not get better with treatment. °· A very bad headache that does not get better. °· Throwing up that does not get better with treatment. °· Sudden, fast weight gain. °· Sudden swelling in your hands, ankles, or face. °· Bleeding from your vagina. °· Blood in your pee. °· Fewer movements from your baby than usual. °· Blurry vision. °· Double vision. °· Muscle twitching. °· Sudden muscle tightening (spasms). °· Trouble breathing. °· Blue fingernails or lips. °Summary °· Hypertension is also called high blood pressure. High blood pressure means that the force of your blood moving  in your body is too strong. °· When you are pregnant, this condition should be watched carefully. It can cause problems for you and your baby. °· Get help right away if you have symptoms that your doctor told you to watch for. °This information is not intended to replace advice given to you by your health care provider. Make sure you discuss any questions you have with your health care provider. °Document Released: 04/20/2010 Document Revised: 03/04/2017 Document Reviewed: 11/28/2015 °Elsevier Interactive Patient Education © 2019 Elsevier Inc. ° °

## 2018-05-12 NOTE — Telephone Encounter (Signed)
Called patient to discuss her [place] results and induction; patient understands need to come back tomorrow for induction; provided reassurance and support to patient about IUGR and hypertension diagnosis.  Traci Carney

## 2018-05-12 NOTE — Procedures (Signed)
Traci Carney 31-Aug-1995 [redacted]w[redacted]d  Fetus A Non-Stress Test Interpretation for 05/12/18  Indication: IUGR  Fetal Heart Rate A Mode: External Baseline Rate (A): 125 bpm Variability: Moderate Accelerations: 15 x 15 Decelerations: None Multiple birth?: No  Uterine Activity Mode: Toco Contraction Frequency (min): occ UC noted. Contraction Duration (sec): 60-180 Contraction Quality: Mild Resting Tone Palpated: Relaxed Resting Time: Adequate  Interpretation (Fetal Testing) Nonstress Test Interpretation: Reactive Comments: FHR tracing rev'd by Dr. Judeth Cornfield

## 2018-05-13 ENCOUNTER — Inpatient Hospital Stay (HOSPITAL_COMMUNITY)
Admission: RE | Admit: 2018-05-13 | Discharge: 2018-05-16 | DRG: 807 | Disposition: A | Discharge status: Home / Self Care | Payer: Medicaid Other | Attending: Obstetrics and Gynecology | Admitting: Obstetrics and Gynecology

## 2018-05-13 ENCOUNTER — Other Ambulatory Visit: Payer: Self-pay

## 2018-05-13 ENCOUNTER — Inpatient Hospital Stay (HOSPITAL_COMMUNITY): Payer: Medicaid Other | Admitting: Anesthesiology

## 2018-05-13 ENCOUNTER — Encounter (HOSPITAL_COMMUNITY): Payer: Self-pay

## 2018-05-13 DIAGNOSIS — O365930 Maternal care for other known or suspected poor fetal growth, third trimester, not applicable or unspecified: Secondary | ICD-10-CM | POA: Diagnosis present

## 2018-05-13 DIAGNOSIS — O3403 Maternal care for unspecified congenital malformation of uterus, third trimester: Secondary | ICD-10-CM | POA: Diagnosis present

## 2018-05-13 DIAGNOSIS — Z3A37 37 weeks gestation of pregnancy: Secondary | ICD-10-CM | POA: Diagnosis not present

## 2018-05-13 DIAGNOSIS — Z87891 Personal history of nicotine dependence: Secondary | ICD-10-CM | POA: Diagnosis not present

## 2018-05-13 DIAGNOSIS — O365990 Maternal care for other known or suspected poor fetal growth, unspecified trimester, not applicable or unspecified: Secondary | ICD-10-CM | POA: Diagnosis present

## 2018-05-13 DIAGNOSIS — O139 Gestational [pregnancy-induced] hypertension without significant proteinuria, unspecified trimester: Secondary | ICD-10-CM | POA: Diagnosis present

## 2018-05-13 DIAGNOSIS — O134 Gestational [pregnancy-induced] hypertension without significant proteinuria, complicating childbirth: Principal | ICD-10-CM | POA: Diagnosis present

## 2018-05-13 DIAGNOSIS — Z3490 Encounter for supervision of normal pregnancy, unspecified, unspecified trimester: Secondary | ICD-10-CM | POA: Diagnosis present

## 2018-05-13 DIAGNOSIS — Q5128 Other doubling of uterus, other specified: Secondary | ICD-10-CM

## 2018-05-13 DIAGNOSIS — Q512 Other doubling of uterus, unspecified: Secondary | ICD-10-CM | POA: Diagnosis not present

## 2018-05-13 LAB — COMPREHENSIVE METABOLIC PANEL
A/G RATIO: 1.4 (ref 1.2–2.2)
ALBUMIN: 3.6 g/dL — AB (ref 3.9–5.0)
ALT: 16 IU/L (ref 0–32)
ALT: 18 U/L (ref 0–44)
AST: 21 IU/L (ref 0–40)
AST: 24 U/L (ref 15–41)
Albumin: 3.2 g/dL — ABNORMAL LOW (ref 3.5–5.0)
Alkaline Phosphatase: 178 IU/L — ABNORMAL HIGH (ref 39–117)
Alkaline Phosphatase: 171 U/L — ABNORMAL HIGH (ref 38–126)
Anion gap: 9 (ref 5–15)
BUN/Creatinine Ratio: 11 (ref 9–23)
BUN: 8 mg/dL (ref 6–20)
BUN: 10 mg/dL (ref 6–20)
CHLORIDE: 103 mmol/L (ref 96–106)
CO2: 17 mmol/L — AB (ref 20–29)
CO2: 19 mmol/L — ABNORMAL LOW (ref 22–32)
Calcium: 9.4 mg/dL (ref 8.7–10.2)
Calcium: 9 mg/dL (ref 8.9–10.3)
Chloride: 105 mmol/L (ref 98–111)
Creatinine, Ser: 0.73 mg/dL (ref 0.57–1.00)
Creatinine, Ser: 0.66 mg/dL (ref 0.44–1.00)
GFR calc Af Amer: >60 mL/min (ref >60)
GFR calc non Af Amer: >60 mL/min (ref >60)
GFR, EST AFRICAN AMERICAN: 133 mL/min/{1.73_m2} (ref >59)
GFR, EST NON AFRICAN AMERICAN: 116 mL/min/{1.73_m2} (ref >59)
GLOBULIN, TOTAL: 2.5 g/dL (ref 1.5–4.5)
GLUCOSE: 69 mg/dL (ref 65–99)
Glucose, Bld: 79 mg/dL (ref 70–99)
Potassium: 4.2 mmol/L (ref 3.5–5.2)
Potassium: 3.8 mmol/L (ref 3.5–5.1)
SODIUM: 135 mmol/L (ref 134–144)
Sodium: 133 mmol/L — ABNORMAL LOW (ref 135–145)
TOTAL PROTEIN: 6.4 g/dL — AB (ref 6.5–8.1)
Total Bilirubin: 0.7 mg/dL (ref 0.3–1.2)
Total Protein: 6.1 g/dL (ref 6.0–8.5)

## 2018-05-13 LAB — TYPE AND SCREEN
ABO/RH(D): O POS
Antibody Screen: NEGATIVE

## 2018-05-13 LAB — PROTEIN / CREATININE RATIO, URINE
Creatinine, Urine: 148.7 mg/dL
PROTEIN UR: 50.2 mg/dL
Protein/Creat Ratio: 338 mg/g creat — ABNORMAL HIGH (ref 0–200)

## 2018-05-13 LAB — GROUP B STREP BY PCR: Group B strep by PCR: NEGATIVE

## 2018-05-13 LAB — CBC
HEMATOCRIT: 37.2 % (ref 36.0–46.0)
HEMOGLOBIN: 12.4 g/dL (ref 11.1–15.9)
Hematocrit: 36.7 % (ref 34.0–46.6)
Hemoglobin: 12.4 g/dL (ref 12.0–15.0)
MCH: 27.5 pg (ref 26.6–33.0)
MCH: 27.4 pg (ref 26.0–34.0)
MCHC: 33.8 g/dL (ref 31.5–35.7)
MCHC: 33.3 g/dL (ref 30.0–36.0)
MCV: 81 fL (ref 79–97)
MCV: 82.3 fL (ref 80.0–100.0)
Platelets: 235 10*3/uL (ref 150–450)
Platelets: 229 10*3/uL (ref 150–400)
RBC: 4.51 x10E6/uL (ref 3.77–5.28)
RBC: 4.52 MIL/uL (ref 3.87–5.11)
RDW: 15.1 % (ref 11.7–15.4)
RDW: 15.3 % (ref 11.5–15.5)
WBC: 7.5 10*3/uL (ref 3.4–10.8)
WBC: 7.7 10*3/uL (ref 4.0–10.5)
nRBC: 0 % (ref 0.0–0.2)

## 2018-05-13 LAB — ABO/RH: ABO/RH(D): O POS

## 2018-05-13 LAB — GC/CHLAMYDIA PROBE AMP (~~LOC~~) NOT AT ARMC
Chlamydia: NEGATIVE
Neisseria Gonorrhea: NEGATIVE

## 2018-05-13 LAB — OB RESULTS CONSOLE GBS: STREP GROUP B AG: NEGATIVE

## 2018-05-13 LAB — RPR: RPR Ser Ql: NONREACTIVE

## 2018-05-13 MED ORDER — OXYTOCIN 40 UNITS IN NORMAL SALINE INFUSION - SIMPLE MED
1.0000 m[IU]/min | INTRAVENOUS | Status: DC
  Administered 2018-05-13: 2 m[IU]/min via INTRAVENOUS
  Filled 2018-05-13: qty 1000

## 2018-05-13 MED ORDER — PENICILLIN G 3 MILLION UNITS IVPB - SIMPLE MED: 3.0000 10*6.[IU] | INTRAVENOUS | Status: DC

## 2018-05-13 MED ORDER — LIDOCAINE HCL (PF) 1 % IJ SOLN
30.0000 mL | INTRAMUSCULAR | Status: DC | PRN
  Filled 2018-05-13: qty 30

## 2018-05-13 MED ORDER — LACTATED RINGERS IV SOLN: 500.0000 mL | INTRAVENOUS | Status: DC | PRN

## 2018-05-13 MED ORDER — PHENYLEPHRINE 40 MCG/ML (10ML) SYRINGE FOR IV PUSH (FOR BLOOD PRESSURE SUPPORT)
PREFILLED_SYRINGE | INTRAVENOUS | Status: AC
  Filled 2018-05-13: qty 10

## 2018-05-13 MED ORDER — OXYCODONE-ACETAMINOPHEN 5-325 MG PO TABS: 1.0000 | ORAL_TABLET | ORAL | Status: DC | PRN

## 2018-05-13 MED ORDER — PHENYLEPHRINE 40 MCG/ML (10ML) SYRINGE FOR IV PUSH (FOR BLOOD PRESSURE SUPPORT)
80.0000 ug | PREFILLED_SYRINGE | INTRAVENOUS | Status: DC | PRN
  Filled 2018-05-13: qty 10

## 2018-05-13 MED ORDER — MAGNESIUM SULFATE BOLUS VIA INFUSION
4.0000 g | Freq: Once | INTRAVENOUS | Status: AC
  Administered 2018-05-13: 4 g via INTRAVENOUS
  Filled 2018-05-13: qty 500

## 2018-05-13 MED ORDER — OXYTOCIN 40 UNITS IN NORMAL SALINE INFUSION - SIMPLE MED
2.5000 [IU]/h | INTRAVENOUS | Status: DC
  Administered 2018-05-14: 2.5 [IU]/h via INTRAVENOUS

## 2018-05-13 MED ORDER — OXYTOCIN BOLUS FROM INFUSION
500.0000 mL | Freq: Once | INTRAVENOUS | Status: AC
  Administered 2018-05-14: 500 mL via INTRAVENOUS

## 2018-05-13 MED ORDER — EPHEDRINE 5 MG/ML INJ
10.0000 mg | INTRAVENOUS | Status: DC | PRN
  Filled 2018-05-13: qty 2

## 2018-05-13 MED ORDER — FENTANYL CITRATE (PF) 100 MCG/2ML IJ SOLN
100.0000 ug | INTRAMUSCULAR | Status: DC | PRN
  Administered 2018-05-13 (×2): 100 ug via INTRAVENOUS
  Filled 2018-05-13 (×2): qty 2

## 2018-05-13 MED ORDER — OXYCODONE-ACETAMINOPHEN 5-325 MG PO TABS: 2.0000 | ORAL_TABLET | ORAL | Status: DC | PRN

## 2018-05-13 MED ORDER — LABETALOL HCL 5 MG/ML IV SOLN: 40.0000 mg | INTRAVENOUS | Status: DC | PRN

## 2018-05-13 MED ORDER — FENTANYL 2.5 MCG/ML BUPIVACAINE 1/10 % EPIDURAL INFUSION (WH - ANES)
14.0000 mL/h | INTRAMUSCULAR | Status: DC | PRN
  Administered 2018-05-13: 14 mL/h via EPIDURAL

## 2018-05-13 MED ORDER — DIPHENHYDRAMINE HCL 50 MG/ML IJ SOLN: 12.5000 mg | INTRAMUSCULAR | Status: DC | PRN

## 2018-05-13 MED ORDER — MAGNESIUM SULFATE 40 G IN LACTATED RINGERS - SIMPLE
2.0000 g/h | INTRAVENOUS | Status: AC
  Administered 2018-05-14 (×2): 2 g/h via INTRAVENOUS
  Filled 2018-05-13 (×3): qty 500

## 2018-05-13 MED ORDER — TERBUTALINE SULFATE 1 MG/ML IJ SOLN: 0.2500 mg | Freq: Once | INTRAMUSCULAR | Status: DC | PRN

## 2018-05-13 MED ORDER — ONDANSETRON HCL 4 MG/2ML IJ SOLN: 4.0000 mg | Freq: Four times a day (QID) | INTRAMUSCULAR | Status: DC | PRN

## 2018-05-13 MED ORDER — LABETALOL HCL 5 MG/ML IV SOLN: 80.0000 mg | INTRAVENOUS | Status: DC | PRN

## 2018-05-13 MED ORDER — SOD CITRATE-CITRIC ACID 500-334 MG/5ML PO SOLN: 30.0000 mL | ORAL | Status: DC | PRN

## 2018-05-13 MED ORDER — ACETAMINOPHEN 325 MG PO TABS: 650.0000 mg | ORAL_TABLET | ORAL | Status: DC | PRN

## 2018-05-13 MED ORDER — LACTATED RINGERS IV SOLN
INTRAVENOUS | Status: DC
  Administered 2018-05-13 (×3): via INTRAVENOUS

## 2018-05-13 MED ORDER — HYDRALAZINE HCL 20 MG/ML IJ SOLN: 10.0000 mg | INTRAMUSCULAR | Status: DC | PRN

## 2018-05-13 MED ORDER — LACTATED RINGERS IV SOLN: 500.0000 mL | Freq: Once | INTRAVENOUS | Status: DC

## 2018-05-13 MED ORDER — LABETALOL HCL 5 MG/ML IV SOLN
20.0000 mg | INTRAVENOUS | Status: DC | PRN
  Administered 2018-05-13: 20 mg via INTRAVENOUS
  Filled 2018-05-13: qty 4

## 2018-05-13 MED ORDER — FENTANYL 2.5 MCG/ML BUPIVACAINE 1/10 % EPIDURAL INFUSION (WH - ANES)
INTRAMUSCULAR | Status: AC
  Filled 2018-05-13: qty 100

## 2018-05-13 MED ORDER — LIDOCAINE HCL (PF) 1 % IJ SOLN
INTRAMUSCULAR | Status: DC | PRN
  Administered 2018-05-13: 6 mL via EPIDURAL

## 2018-05-13 MED ORDER — SODIUM CHLORIDE 0.9 % IV SOLN
5.0000 10*6.[IU] | Freq: Once | INTRAVENOUS | Status: AC
  Administered 2018-05-13: 5 10*6.[IU] via INTRAVENOUS
  Filled 2018-05-13: qty 5

## 2018-05-13 MED ORDER — MISOPROSTOL 25 MCG QUARTER TABLET
25.0000 ug | ORAL_TABLET | ORAL | Status: DC | PRN
  Administered 2018-05-13: 25 ug via VAGINAL
  Filled 2018-05-13 (×2): qty 1

## 2018-05-13 NOTE — Anesthesia Preprocedure Evaluation (Signed)
Anesthesia Evaluation  Patient identified by MRN, date of birth, ID band Patient awake    Reviewed: Allergy & Precautions, H&P , NPO status , Patient's Chart, lab work & pertinent test results, reviewed documented beta blocker date and time   Airway Mallampati: II  TM Distance: >3 FB Neck ROM: full    Dental no notable dental hx.    Pulmonary neg pulmonary ROS, former smoker,    Pulmonary exam normal breath sounds clear to auscultation       Cardiovascular negative cardio ROS Normal cardiovascular exam Rhythm:regular Rate:Normal     Neuro/Psych negative neurological ROS  negative psych ROS   GI/Hepatic negative GI ROS, Neg liver ROS,   Endo/Other  negative endocrine ROS  Renal/GU negative Renal ROS  negative genitourinary   Musculoskeletal   Abdominal   Peds  Hematology negative hematology ROS (+)   Anesthesia Other Findings   Reproductive/Obstetrics (+) Pregnancy                             Anesthesia Physical Anesthesia Plan  ASA: II  Anesthesia Plan: Epidural   Post-op Pain Management:    Induction:   PONV Risk Score and Plan:   Airway Management Planned:   Additional Equipment:   Intra-op Plan:   Post-operative Plan:   Informed Consent: I have reviewed the patients History and Physical, chart, labs and discussed the procedure including the risks, benefits and alternatives for the proposed anesthesia with the patient or authorized representative who has indicated his/her understanding and acceptance.       Plan Discussed with: Anesthesiologist, Surgeon and CRNA  Anesthesia Plan Comments:         Anesthesia Quick Evaluation

## 2018-05-13 NOTE — Anesthesia Pain Management Evaluation Note (Signed)
  CRNA Pain Management Visit Note  Patient: Traci Carney, 23 y.o., female  "Hello I am a member of the anesthesia team at [facility]. We have an anesthesia team available at all times to provide care throughout the hospital, including epidural management and anesthesia for C-section. I don't know your plan for the delivery whether it a natural birth, water birth, IV sedation, nitrous supplementation, doula or epidural, but we want to meet your pain goals."   1.Was your pain managed to your expectations on prior hospitalizations?   No prior hospitalizations  2.What is your expectation for pain management during this hospitalization?     Epidural  3.How can we help you reach that goal? unsure  Record the patient's initial score and the patient's pain goal.   Pain: 0  Pain Goal: 7 [facility] wants you to be able to say your pain was always managed very well.  Cephus Shelling 05/13/2018

## 2018-05-13 NOTE — Anesthesia Procedure Notes (Signed)
Epidural Patient location during procedure: OB Start time: 05/13/2018 9:26 PM End time: 05/13/2018 9:32 PM  Staffing Anesthesiologist: Bethena Midget, MD  Preanesthetic Checklist Completed: patient identified, site marked, surgical consent, pre-op evaluation, timeout performed, IV checked, risks and benefits discussed and monitors and equipment checked  Epidural Patient position: sitting Prep: site prepped and draped and DuraPrep Patient monitoring: continuous pulse ox and blood pressure Approach: midline Location: L3-L4 Injection technique: LOR air  Needle:  Needle type: Tuohy  Needle gauge: 17 G Needle length: 9 cm and 9 Needle insertion depth: 8 cm Catheter type: closed end flexible Catheter size: 19 Gauge Catheter at skin depth: 13 cm Test dose: negative  Assessment Events: blood not aspirated, injection not painful, no injection resistance, negative IV test and no paresthesia

## 2018-05-13 NOTE — H&P (Addendum)
Traci Carney is a 23 y.o. female presenting for IOL for IUGR and gestational hypertension. Pregnancy complicated by septate uterus.  OB History    Gravida  1   Para      Term      Preterm      AB      Living        SAB      TAB      Ectopic      Multiple      Live Births             No past medical history on file. Past Surgical History:  Procedure Laterality Date  . HERNIA REPAIR    . TONSILLECTOMY     Family History: family history includes Hypertension in her mother. Social History:  reports that she has quit smoking. She has never used smokeless tobacco. She reports previous alcohol use. She reports that she does not use drugs.     Maternal Diabetes: No Genetic Screening: Normal Maternal Ultrasounds/Referrals: Abnormal:  Findings:   IUGR Fetal Ultrasounds or other Referrals:  None Maternal Substance Abuse:  No Significant Maternal Medications:  None Significant Maternal Lab Results:  None Other Comments:  None  ROS History   Last menstrual period 08/27/2017, unknown if currently breastfeeding. Maternal Exam:  Abdomen: Estimated fetal weight is 5#.   Fetal presentation: vertex  Introitus: Normal vulva. Normal vagina.  Amniotic fluid character: not assessed.  Pelvis: adequate for delivery.   Cervix: Cervix evaluated by digital exam.     Physical Exam  Constitutional: She is oriented to person, place, and time. She appears well-developed and well-nourished.  HENT:  Head: Normocephalic and atraumatic.  Right Ear: External ear normal.  Left Ear: External ear normal.  Cardiovascular: Normal rate.  Respiratory: Effort normal.  GI: Soft. Bowel sounds are normal. She exhibits no distension and no mass. There is no abdominal tenderness. There is no rebound and no guarding.  Genitourinary:    Vulva normal.   Neurological: She is alert and oriented to person, place, and time.  Skin: Skin is warm and dry.  Psychiatric: She has a normal mood and  affect. Her behavior is normal. Judgment and thought content normal.    Prenatal labs: ABO, Rh: O/Positive/-- (08/01 1139) Antibody: Negative (08/01 1139) Rubella: 2.26 (08/01 1139) RPR: Non Reactive (12/20 0955)  HBsAg: Negative (08/01 1139)  HIV: Non Reactive (12/20 0955)  GBS:     Assessment/Plan: GHTN - rpt labs this AM. Mild ranges. No magnesium needed Cytotec induction GBS pending - will treat until GBS returns. Expectant management.   Levie Heritage 05/13/2018, 7:27 AM

## 2018-05-13 NOTE — Progress Notes (Signed)
Traci Carney is a 23 y.o. G1P0 at [redacted]w[redacted]d by LMP admitted for induction of labor due to gestational hypertension and IUGR.   Subjective: Grandma and best friend in the room for support. Patient feeling crampy contractions.   Objective: BP (!) 141/89   Pulse 71   Temp 98.7 F (37.1 C) (Oral)   Resp 18   Ht 5\' 6"  (1.676 m)   Wt 89 kg   LMP 08/27/2017 (Exact Date)   BMI 31.69 kg/m  No intake/output data recorded. No intake/output data recorded.  FHT:  FHR: 130 bpm, variability: moderate,  accelerations:  Present,  decelerations:  Present isolated variables  UC:   irregular, every 1-5 minutes SVE:   Dilation: 1.5 Effacement (%): 50 Station: -2 Exam by:: Leftwich-Kirby, CNM Foley bulb inserted without difficultly. Patient tolerated well. Filled to 60 mls by RN.  Labs: Lab Results  Component Value Date   WBC 7.7 05/13/2018   HGB 12.4 05/13/2018   HCT 37.2 05/13/2018   MCV 82.3 05/13/2018   PLT 229 05/13/2018    Assessment / Plan: Induction of labor due to GHTN and IUGR,  progressing well on pitocin Foley bulb in place Labor: Progressing normally Preeclampsia:  labs stable Fetal Wellbeing:  Category II with variables, overall category 1.  Pain Control:  Labor support without medications I/D:  GBS unknown Anticipated MOD:  NSVD  Sharen Counter 05/13/2018, 11:46 AM

## 2018-05-13 NOTE — Progress Notes (Signed)
Traci Carney is a 23 y.o. G1P0 at [redacted]w[redacted]d by LMP admitted for induction of labor due to gHTN and IUGR.  Subjective: Pt feeling cramping/mild contractions. Family in room for support.  Objective: BP (!) 143/87   Pulse 74   Temp 98.3 F (36.8 C) (Oral)   Resp 16   Ht 5\' 6"  (1.676 m)   Wt 89 kg   LMP 08/27/2017 (Exact Date)   BMI 31.69 kg/m  No intake/output data recorded. No intake/output data recorded.  FHT:  FHR: 125 bpm, variability: moderate,  accelerations:  Present,  decelerations:  Absent UC:   regular, every 2-3 minutes SVE:   Dilation: 4 Effacement (%): 60 Station: -2 Exam by:: Raliegh Ip RN Foley bulb out at [employer]: Lab Results  Component Value Date   WBC 7.7 05/13/2018   HGB 12.4 05/13/2018   HCT 37.2 05/13/2018   MCV 82.3 05/13/2018   PLT 229 05/13/2018    Assessment / Plan: Induction of labor due to gHTN, IUGR FB out Start Pitocin  Labor: Progressing normally Preeclampsia:  labs stable Fetal Wellbeing:  Category I Pain Control:  Labor support without medications I/D:  GBS unknown Anticipated MOD:  NSVD  Sharen Counter 05/13/2018, 3:34 PM

## 2018-05-13 NOTE — Progress Notes (Signed)
LABOR PROGRESS NOTE  Traci Carney is a 23 y.o. G1P0 at [redacted]w[redacted]d  admitted for IOL for gHTN and IUGR.  Subjective: Patient feeling strong contractions, about to get epidural placed. FOB, patient's mother, and godmother of baby in room.  Objective: BP (!) 160/104   Pulse 71   Temp 98.6 F (37 C) (Oral)   Resp 18   Ht 5\' 6"  (1.676 m)   Wt 89 kg   LMP 08/27/2017 (Exact Date)   SpO2 99%   BMI 31.69 kg/m  or  Vitals:   05/13/18 2145 05/13/18 2150 05/13/18 2155 05/13/18 2156  BP: (!) 147/91 (!) 157/111 (!) 160/104 (!) 160/104  Pulse: 66 72 71 71  Resp:      Temp:      TempSrc:      SpO2: 99% 100% 99% 99%  Weight:      Height:        Dilation: 5 Effacement (%): 60 Cervical Position: Posterior Station: -3 Presentation: Vertex Exam by:: R. Craft, RN FHT: baseline rate 125, moderate varibility, no acel, early decel Toco: ctx q 3-5 min  Labs: Lab Results  Component Value Date   WBC 7.7 05/13/2018   HGB 12.4 05/13/2018   HCT 37.2 05/13/2018   MCV 82.3 05/13/2018   PLT 229 05/13/2018    Patient Active Problem List   Diagnosis Date Noted  . Encounter for induction of labor 05/13/2018  . IUGR (intrauterine growth restriction) affecting care of mother 05/13/2018  . Gestational hypertension 05/12/2018  . Trauma during pregnancy 04/08/2018  . Supervision of low-risk first pregnancy 10/30/2017  . Septate uterus 10/01/2017    Assessment / Plan: 23 y.o. G1P0 at [redacted]w[redacted]d here for IOL for gHTN and IUGR.  Labor: latent, s/p FB and cytotec x1, pitocin started at 1415 Fetal Wellbeing:  Cat 1 Pain Control:  epidural Anticipated MOD:  SVD  [name], D.O.  05/13/2018, 10:11 PM

## 2018-05-14 ENCOUNTER — Ambulatory Visit: Payer: Self-pay

## 2018-05-14 ENCOUNTER — Encounter (HOSPITAL_COMMUNITY): Payer: Self-pay

## 2018-05-14 DIAGNOSIS — O365930 Maternal care for other known or suspected poor fetal growth, third trimester, not applicable or unspecified: Secondary | ICD-10-CM

## 2018-05-14 DIAGNOSIS — Z3A37 37 weeks gestation of pregnancy: Secondary | ICD-10-CM

## 2018-05-14 DIAGNOSIS — O134 Gestational [pregnancy-induced] hypertension without significant proteinuria, complicating childbirth: Secondary | ICD-10-CM

## 2018-05-14 LAB — CBC
HCT: 34.9 % — ABNORMAL LOW (ref 36.0–46.0)
Hemoglobin: 11.8 g/dL — ABNORMAL LOW (ref 12.0–15.0)
MCH: 27.6 pg (ref 26.0–34.0)
MCHC: 33.8 g/dL (ref 30.0–36.0)
MCV: 81.7 fL (ref 80.0–100.0)
Platelets: 209 10*3/uL (ref 150–400)
RBC: 4.27 MIL/uL (ref 3.87–5.11)
RDW: 15.3 % (ref 11.5–15.5)
WBC: 12.3 10*3/uL — ABNORMAL HIGH (ref 4.0–10.5)
nRBC: 0 % (ref 0.0–0.2)

## 2018-05-14 MED ORDER — DIPHENHYDRAMINE HCL 25 MG PO CAPS: 25.0000 mg | ORAL_CAPSULE | Freq: Four times a day (QID) | ORAL | Status: DC | PRN

## 2018-05-14 MED ORDER — IBUPROFEN 600 MG PO TABS
600.0000 mg | ORAL_TABLET | Freq: Four times a day (QID) | ORAL | Status: DC
  Administered 2018-05-14 – 2018-05-16 (×10): 600 mg via ORAL
  Filled 2018-05-14 (×10): qty 1

## 2018-05-14 MED ORDER — BENZOCAINE-MENTHOL 20-0.5 % EX AERO
1.0000 "application " | INHALATION_SPRAY | CUTANEOUS | Status: DC | PRN
  Administered 2018-05-14: 1 via TOPICAL
  Filled 2018-05-14: qty 56

## 2018-05-14 MED ORDER — SENNOSIDES-DOCUSATE SODIUM 8.6-50 MG PO TABS
2.0000 | ORAL_TABLET | ORAL | Status: DC
  Administered 2018-05-15: 2 via ORAL
  Filled 2018-05-14 (×2): qty 2

## 2018-05-14 MED ORDER — PRENATAL MULTIVITAMIN CH
1.0000 | ORAL_TABLET | Freq: Every day | ORAL | Status: DC
  Administered 2018-05-14 – 2018-05-16 (×3): 1 via ORAL
  Filled 2018-05-14 (×3): qty 1

## 2018-05-14 MED ORDER — DIBUCAINE 1 % RE OINT: 1.0000 "application " | TOPICAL_OINTMENT | RECTAL | Status: DC | PRN

## 2018-05-14 MED ORDER — SIMETHICONE 80 MG PO CHEW: 80.0000 mg | CHEWABLE_TABLET | ORAL | Status: DC | PRN

## 2018-05-14 MED ORDER — ONDANSETRON HCL 4 MG/2ML IJ SOLN: 4.0000 mg | INTRAMUSCULAR | Status: DC | PRN

## 2018-05-14 MED ORDER — ONDANSETRON HCL 4 MG PO TABS: 4.0000 mg | ORAL_TABLET | ORAL | Status: DC | PRN

## 2018-05-14 MED ORDER — WITCH HAZEL-GLYCERIN EX PADS: 1.0000 "application " | MEDICATED_PAD | CUTANEOUS | Status: DC | PRN

## 2018-05-14 MED ORDER — LACTATED RINGERS IV SOLN
INTRAVENOUS | Status: DC
  Administered 2018-05-14 (×2): via INTRAVENOUS

## 2018-05-14 MED ORDER — ACETAMINOPHEN 325 MG PO TABS
650.0000 mg | ORAL_TABLET | ORAL | Status: DC | PRN
  Administered 2018-05-15 – 2018-05-16 (×2): 650 mg via ORAL
  Filled 2018-05-14 (×2): qty 2

## 2018-05-14 MED ORDER — TETANUS-DIPHTH-ACELL PERTUSSIS 5-2.5-18.5 LF-MCG/0.5 IM SUSP: 0.5000 mL | Freq: Once | INTRAMUSCULAR | Status: DC

## 2018-05-14 MED ORDER — COCONUT OIL OIL: 1.0000 "application " | TOPICAL_OIL | Status: DC | PRN

## 2018-05-14 NOTE — Lactation Note (Signed)
This note was copied from a baby's chart. Lactation Consultation Note  Patient Name: Traci Carney [id number] Date: 05/14/2018 Reason for consult: Initial assessment;Infant < 6lbs;Early term 37-38.6wks;[place] Baby is 10 hours old/37.1 weeks.  Baby is IUGR and weighed 4-10.8 at birth.  He has nuzzled at breast and is getting formula supplementation with slow flow nipple.  Mom has initiated pumping with symphony pump.  Discussed late preterm behavior.  Stressed importance of stimulating milk supply by pumping 8-12 times per day.  Mom states she is comfortable with pump. She does not have a pump at home but has WIC.  Pump referral faxed to [facility] office.  Instructed to attempt latching baby with cues calling for assist prn, pump every 3 hours x 15-20 minutes and supplement baby with expressed milk/formula per volume guidelines.  Maternal Data    Feeding Feeding Type: Bottle Fed - Formula  LATCH Score Latch: Too sleepy or reluctant, no latch achieved, no sucking elicited.  Audible Swallowing: None              Interventions    Lactation Tools Discussed/Used WIC Program: Yes Initiated by:: RN Date initiated:: 05/14/18   Consult Status Consult Status: Follow-up Date: 05/15/18 Follow-up type: In-patient    [name] 05/14/2018, 11:34 AM

## 2018-05-14 NOTE — Anesthesia Postprocedure Evaluation (Signed)
Anesthesia Post Note  Patient: Traci Carney  Procedure(s) Performed: AN AD HOC LABOR EPIDURAL     Patient location during evaluation: Mother Baby Anesthesia Type: Epidural Level of consciousness: awake and alert and oriented Pain management: satisfactory to patient Vital Signs Assessment: post-procedure vital signs reviewed and stable Respiratory status: respiratory function stable Cardiovascular status: stable Postop Assessment: no headache, no backache, epidural receding, patient able to bend at knees, no signs of nausea or vomiting and adequate PO intake Anesthetic complications: no    Last Vitals:  Vitals:   05/14/18 0720 05/14/18 0807  BP:  (!) 141/83  Pulse:  80  Resp: 16 18  Temp:  36.6 C  SpO2:  98%    Last Pain:  Vitals:   05/14/18 0807  TempSrc: Oral  PainSc:    Pain Goal: Patients Stated Pain Goal: 1 (05/14/18 0331)                 Karleen Dolphin

## 2018-05-15 ENCOUNTER — Encounter (HOSPITAL_COMMUNITY): Payer: Self-pay

## 2018-05-15 LAB — CULTURE, BETA STREP (GROUP B ONLY): Strep Gp B Culture: NEGATIVE

## 2018-05-15 MED ORDER — LABETALOL HCL 200 MG PO TABS
200.0000 mg | ORAL_TABLET | Freq: Once | ORAL | Status: AC
  Administered 2018-05-15: 200 mg via ORAL
  Filled 2018-05-15: qty 1

## 2018-05-15 MED ORDER — AMLODIPINE BESYLATE 10 MG PO TABS
10.0000 mg | ORAL_TABLET | Freq: Every day | ORAL | Status: DC
  Administered 2018-05-15 – 2018-05-16 (×2): 10 mg via ORAL
  Filled 2018-05-15 (×3): qty 1

## 2018-05-15 NOTE — Progress Notes (Signed)
Pt c/o of tight/heavy chest.  Bp 150/97, Dr. Emelda Fear notified.  RN to give Labetalol 200mg  once.

## 2018-05-15 NOTE — Lactation Note (Signed)
This note was copied from a baby's chart. Lactation Consultation Note  Patient Name: Traci Carney AYOKH'T Date: 05/15/2018 Reason for consult: Follow-up assessment;Infant < 6lbs;Early term 37-38.6wks;Primapara;1st time breastfeeding  P1 mother whose infant is now 29 hours old.  This is an ETI at 37+1 weeks weighing < 6lbs  Mother had just finished supplementing with Sim 22 when I arrived.  Baby took 28 mls and was sleeping in her arms.  Mother is not having any difficulty bottle feeding baby.  Reviewed LPTI policy with mother and she understands her feeding guideline supplementation.  Encouraged mother to increase volumes per policy but to also allow baby to feed more if desired.    Mother has not been putting baby to breast.  She stated that he does show feeding cues.  Reviewed the importance of putting baby to breast when he shows cues to help stimulate milk production.  Encouraged hand expression before/after feedings to aid in obtaining supply.  Colostrum container provided and milk storage times reviewed.  Finger feeding demonstrated.  Mother was not aware of the importance of these actions.  Reminded her to be diligent and pump at least 8 times/24 hours after attempting breastfeeding.  She knows to always put baby to breast prior to pumping.    Offered to assist mother with latching as needed.  She will call her RN/LC as needed.  Previous LC has faxed a [facility] referral for mother.   Maternal Data Formula Feeding for Exclusion: Yes Reason for exclusion: Mother's choice to formula and breast feed on admission Has patient been taught Hand Expression?: Yes Does the patient have breastfeeding experience prior to this delivery?: No  Feeding Feeding Type: Bottle Fed - Formula Nipple Type: Slow - flow  LATCH Score                   Interventions    Lactation Tools Discussed/Used WIC Program: Yes Pump Review: Setup, frequency, and cleaning Initiated by:: Already  initated   Consult Status Consult Status: Follow-up Date: 05/16/18 Follow-up type: In-patient    [name] 05/15/2018, 10:35 AM

## 2018-05-15 NOTE — Progress Notes (Signed)
Post Partum Day 1 s/p VAVD after IOL for severe preeclampsia at [redacted]w[redacted]d  Subjective: Patient denies any headaches, visual symptoms, RUQ/epigastric pain or other concerning symptoms. No complaints, up ad lib, voiding, tolerating PO and + flatus.  Objective: Blood pressure (!) 144/91, pulse 86, temperature 97.8 F (36.6 C), temperature source Oral, resp. rate 17, height 5\' 6"  (1.676 m), weight 89 kg, last menstrual period 08/27/2017, SpO2 100 %, unknown if currently breastfeeding.  Physical Exam:  General: alert and no distress Lochia: appropriate Uterine Fundus: firm DVT Evaluation: No evidence of DVT seen on physical exam. Negative Homan's sign. No cords or calf tenderness.  Recent Labs    05/13/18 0743 05/14/18 0156  HGB 12.4 11.8*  HCT 37.2 34.9*    Assessment/Plan: Norvasc 10 mg daily started for BP control.  Plan for discharge tomorrow, Breastfeeding and Contraception POPs. Routine postpartum care   LOS: 2 days   Jaynie Collins, MD 05/15/2018, 10:23 AM

## 2018-05-16 MED ORDER — IBUPROFEN 600 MG PO TABS: 600.0000 mg | ORAL_TABLET | Freq: Four times a day (QID) | ORAL | 0 refills | Status: DC

## 2018-05-16 MED ORDER — AMLODIPINE BESYLATE 10 MG PO TABS: 10.0000 mg | ORAL_TABLET | Freq: Every day | ORAL | 1 refills | Status: DC

## 2018-05-16 NOTE — Discharge Summary (Signed)
**Note Traci-Identified via Obfuscation** Postpartum Discharge Summary     Patient Name: Traci Carney DOB: 07-Feb-1996 MRN: [id number]  Date of admission: 05/13/2018 Delivering Provider: Arvilla Carney   Date of discharge: 05/16/2018  Admitting diagnosis: INDUCTION for IUGR Intrauterine pregnancy: 110w1d     Secondary diagnosis:  Active Problems:   Septate uterus   Gestational hypertension   Encounter for induction of labor   IUGR (intrauterine growth restriction) affecting care of mother  Additional problems: none     Discharge diagnosis: Term Pregnancy Delivered and iugr                                                                                                Post partum procedures:  Augmentation: Pitocin  Complications: None  Hospital course:  Induction of Labor With Vaginal Delivery   23 y.o. yo G1P1001 at [redacted]w[redacted]d was admitted to the hospital 05/13/2018 for induction of labor.  Indication for induction: iugr.Traci Carney is a 22 y.o. female presenting for IOL for IUGR and gestational hypertension. Pregnancy complicated by septate uterus.  \   Patient had an complicated labor course as follows: Operative Delivery Note At 0046 a viable female was delivered via VAVD.  Presentation: vertex; Position: Occiput,, Anterior; Station: +2.  Verbal consent: obtained from patient.  Risks and benefits discussed in detail.  Risks include, but are not limited to the risks of anesthesia, bleeding, infection, damage to maternal tissues, fetal cephalhematoma.  There is also the risk of inability to effect vaginal delivery of the head, or shoulder dystocia that cannot be resolved by established maneuvers, leading to the need for emergency cesarean section.   Went to delivery room after 4 minute prolonged decel to 60s. Large amounts of clots noted from vagina. Fetal bradycardia not improving with position changes or O2. Dr. Despina Carney called to bedside. Station low enough for vacuum placement. Vacuum placed and infant  delivered after 3 minutes of pushing with one pop off. Total time of fetal bradycardia was 11 minutes. Head delivered OA. No nuchal cord present. Shoulder and body delivered in usual fashion. Cord clamped x 2 at delivery, and cut by family member. Infant taken to warmer where NICU team standing by, noted to have spontaneous cry. Cord blood and pH drawn. Placenta delivered spontaneously with gentle cord traction with expulsion of several clots. Fundus firm with massage and Pitocin. Vagina inspected and found to have right side wall vaginal laceration, which was repaired with 3.0 vicryl with good hemostasis achieved. Placenta sent to pathology due to suspected abruption.   APGAR: 8, 9; weight 2121g  .   Placenta status: spontaneous, intact. Sent to pathology Cord: 3 vessel with the following complications: none.  Cord pH: 7.18.   Anesthesia:  Epidural  Instruments: Mushroom vacuum  Episiotomy:  None  Lacerations:  Right vaginal  Suture Repair: 3.0 vicryl Est. Blood Loss (mL):  272  Mom to postpartum.  Baby to Couplet care / Skin to Skin.  Traci Carney 05/14/2018, 1:07 AM  Membrane Rupture Time/Date: 1:23 PM ,05/13/2018   Intrapartum Procedures: Episiotomy: None [1]  Lacerations:  Vaginal [6]  Patient had delivery of a Viable infant.  Information for the patient's newborn:  Traci Carney, Traci Carney [[id number]]  Delivery Method: Vag-Vacuum   05/14/2018  Details of delivery can be found in separate delivery note.  Patient had a routine postpartum course. Patient is discharged home 05/16/18.  Magnesium Sulfate recieved: No BMZ received: No  Physical exam  Vitals:   05/15/18 1845 05/15/18 2033 05/15/18 2258 05/16/18 0449  BP: (!) 150/97 138/79 129/89 (!) 139/92  Pulse:  95 91 86  Resp:  18 18 18   Temp:  98.5 F (36.9 C) 98.1 F (36.7 C) 98.2 F (36.8 C)  TempSrc:  Oral Oral Oral  SpO2:  99% 100% 100%  Weight:      Height:       General:  alert, cooperative and no distress Lochia: appropriate Uterine Fundus: firm Incision:  DVT Evaluation: No evidence of DVT seen on physical exam. Labs: Lab Results  Component Value Date   WBC 12.3 (H) 05/14/2018   HGB 11.8 (L) 05/14/2018   HCT 34.9 (L) 05/14/2018   MCV 81.7 05/14/2018   PLT 209 05/14/2018   CMP Latest Ref Rng & Units 05/13/2018  Glucose 70 - 99 mg/dL 79  BUN 6 - 20 mg/dL 10  Creatinine 3.83 - 2.91 mg/dL 9.16  Sodium 606 - 004 mmol/L 133(L)  Potassium 3.5 - 5.1 mmol/L 3.8  Chloride 98 - 111 mmol/L 105  CO2 22 - 32 mmol/L 19(L)  Calcium 8.9 - 10.3 mg/dL 9.0  Total Protein 6.5 - 8.1 g/dL 6.4(L)  Total Bilirubin 0.3 - 1.2 mg/dL 0.7  Alkaline Phos 38 - 126 U/L 171(H)  AST 15 - 41 U/L 24  ALT 0 - 44 U/L 18    Discharge instruction: per After Visit Summary and "Baby and Me Booklet".  After visit meds:  Allergies as of 05/16/2018   No Known Allergies     Medication List    STOP taking these medications   COMFORT FIT MATERNITY SUPP SM Misc   terconazole 0.8 % vaginal cream Commonly known as:  TERAZOL 3     TAKE these medications   amLODipine 10 MG tablet Commonly known as:  NORVASC Take 1 tablet (10 mg total) by mouth daily.   ibuprofen 600 MG tablet Commonly known as:  ADVIL,MOTRIN Take 1 tablet (600 mg total) by mouth every 6 (six) hours.   PRENATAL COMPLETE 14-0.4 MG Tabs Take 1 tablet by mouth daily after breakfast.       Diet: routine diet  Activity: Advance as tolerated. Pelvic rest for 6 weeks.   Outpatient follow up:6 weeks Follow up Appt: Future Appointments  Date Time Provider Department Carney  05/21/2018  9:20 AM [facility] NURSE [facility]  06/11/2018  1:55 PM Traci Carney, CNM [facility]OC   Follow up Visit: Follow-up Information    [facility] in 1 month(s).   Specialty:  Obstetrics and Gynecology Contact information: [address] [place]  [address] [phone]           Please schedule this patient for Postpartum visit in: 6 weeks with the following provider: Any provider For C/S patients schedule nurse incision check in weeks 2 weeks: no High risk pregnancy complicated by: iugr Delivery mode:  Vacuum Anticipated Birth Control:  POPs PP Procedures needed:   Schedule Integrated BH visit: no      Newborn Data: Live born female  Birth Weight: 4 lb 10.8 oz (  2121 g) APGAR: 8, 9  Newborn Delivery   Birth date/time:  05/14/2018 00:46:00 Delivery type:  Vaginal, Vacuum (Extractor)     Baby Feeding: Bottle and Breast Disposition:home with mother   05/16/2018 Tilda BurrowJohn V Haru Anspaugh, MD

## 2018-05-16 NOTE — Lactation Note (Signed)
This note was copied from a baby's chart. Lactation Consultation Note: RN asked me to assist mom with latch. Baby awake and fussy when she was in room. She assisted mom with latch but reports baby is sliding off the breast. Suggested NS may be appropriate. When I got to room baby was asleep. Talked with mom about NS and she seemed agreeable to try it. Encouraged to call for assist when baby ready for next feeding. Mom is pumping now. Will not be DC today.   Patient Name: Traci Carney KGYJE'H Date: 05/16/2018 Reason for consult: Follow-up assessment;Early term 37-38.6wks;Infant < 6lbs   Maternal Data Has patient been taught Hand Expression?: Yes Does the patient have breastfeeding experience prior to this delivery?: No  Feeding Feeding Type: Formula Nipple Type: Dr. Lorne Skeens  LATCH Score                   Interventions    Lactation Tools Discussed/Used [facility] Program: Yes   Consult Status Consult Status: Complete    Pamelia Hoit 05/16/2018, 9:52 AM

## 2018-05-16 NOTE — Lactation Note (Addendum)
This note was copied from a baby's chart. Lactation Consultation Note: Follow up visit with this mom of baby born at 74 w 1 d and weighing 4- 10.8 . Mom has been bottle feeding formula. Reports she has not put baby to the breast. Reports she pumped 2 times yesterday but did not obtain any milk. States breasts are feeling heavy this morning. Reviewed importance of frequent pumping to promote milk supply and prevent engorgement. Offered to assist mom with pumping at this time but she refused states she is tired and baby has been fussy most of the night. Has WIC and plans to get pump from them.  Offered [facility] loaner but mom states she does not have the money. Reviewed how to use pump pieces as manual pump. No questions at present. Reviewed our phone number, OP appointments and BFSG as resources for support after DC. To call prn  Patient Name: Boy Mannat Thumann [id number] Date: 05/16/2018 Reason for consult: Follow-up assessment;Early term 37-38.6wks;Infant < 6lbs   Maternal Data Has patient been taught Hand Expression?: Yes Does the patient have breastfeeding experience prior to this delivery?: No  Feeding    LATCH Score                   Interventions    Lactation Tools Discussed/Used WIC Program: Yes   Consult Status Consult Status: Complete    Pamelia Hoit 05/16/2018, 7:56 AM

## 2018-05-17 ENCOUNTER — Ambulatory Visit: Payer: Self-pay

## 2018-05-17 NOTE — Lactation Note (Signed)
This note was copied from a baby's chart. Lactation Consultation Note  Patient Name: Boy Elysse Polidore [id number] Date: 05/17/2018  Baby boy Winzer being d/c today.  Mom reports she has not heard from [facility] regarding DEBP.  Mom reports he is still not latching well.  Discussed always offer breast first then pump/massage/hand express.  Mom having some engorgement on right breast when entered room and unable to get milk out of that side,  Breast swollen, reddened,  and painful.  Showed mom how to massage and hand express to get the milk removed.  Right Breast red and hard and areolar/nipple complex with visible edema.  Mom reports she missed the last pumping.  Reviewed pumping techniques.  Discussed flange size.  Mom has been using 24 mm flanges.  Urged her to use 27 mm flange on left and 30 mm flange on right.  Mom has had nipple piercing and has some visible tissue where the piercing was on right that is rubbing and sticking with the 27 mm flange.  Discussed possible scar tissue from piercings affecting milk removal.  Urged mom to pump a minimum of eight times day.  Everytime he gets a bottle.  Urged mom to pay attention to her body.  Check her breasts for reddenss and hardened areas.  Explained to mom she may need to pump more often to establish good milk supply and keep herself comfortable while they are establishing breastfeeding. Mom reprots she has no pump for home use.  Gave harmony manual pump and demo it.  Showed mom how to use symphony/lactina conversion kit as both a double and single manual pump. Mom reports she feels comfortable going home. Urged mom to call lactation as needed.  Mom reports she has breastfeeding resource list for home use.    Maternal Data    Feeding Feeding Type: Bottle Fed - Breast Milk  LATCH Score                   Interventions    Lactation Tools Discussed/Used     Consult Status      Quintavia Rogstad Thompson Caul 05/17/2018, 11:31 AM

## 2018-05-19 ENCOUNTER — Encounter: Payer: Self-pay | Admitting: Student

## 2018-05-21 ENCOUNTER — Ambulatory Visit: Payer: Self-pay

## 2018-06-11 ENCOUNTER — Encounter: Payer: Self-pay | Admitting: Advanced Practice Midwife

## 2018-06-11 ENCOUNTER — Ambulatory Visit (INDEPENDENT_AMBULATORY_CARE_PROVIDER_SITE_OTHER): Payer: Medicaid Other | Admitting: Clinical

## 2018-06-11 ENCOUNTER — Ambulatory Visit (INDEPENDENT_AMBULATORY_CARE_PROVIDER_SITE_OTHER): Payer: Medicaid Other | Admitting: Advanced Practice Midwife

## 2018-06-11 DIAGNOSIS — Z3009 Encounter for other general counseling and advice on contraception: Secondary | ICD-10-CM

## 2018-06-11 DIAGNOSIS — Z1389 Encounter for screening for other disorder: Secondary | ICD-10-CM

## 2018-06-11 DIAGNOSIS — F4323 Adjustment disorder with mixed anxiety and depressed mood: Secondary | ICD-10-CM

## 2018-06-11 NOTE — Progress Notes (Signed)
Subjective:     Traci Carney is a 23 y.o. female who presents for a postpartum visit. She is 4 weeks postpartum following a spontaneous vaginal delivery. I have fully reviewed the prenatal and intrapartum course. The delivery was at 37 gestational weeks. Outcome: VAVD. Anesthesia: epidural. Postpartum course has been uncomplicated. Baby's course has been uncomplicated. Baby is feeding by bottle - Traci Carney. Bleeding no bleeding. Bowel function is normal. Bladder function is normal. Patient is not sexually active. Contraception method is none. Postpartum depression screening: positive.  Right vaginal tea repaired with 3-0 Vicryl. Patient denies any complaints or discomfort with perineal healing.   The following portions of the patient's history were reviewed and updated as appropriate: allergies, current medications, past family history, past medical history, past social history, past surgical history and problem list.  Review of Systems Pertinent items are noted in HPI.   Objective:    BP 131/81   Pulse 87   Wt 175 lb 3.2 oz (79.5 kg)   LMP 08/27/2017 (Exact Date)   BMI 28.28 kg/m    Physical Exam  Constitutional: She is oriented to person, place, and time and well-developed, well-nourished, and in no distress. No distress.  HENT:  Head: Normocephalic.  Cardiovascular: Normal rate.  Pulmonary/Chest: Effort normal.  Abdominal: Soft. There is no abdominal tenderness. There is no rebound.  Neurological: She is alert and oriented to person, place, and time.  Skin: Skin is warm and dry.  Psychiatric: Affect normal.  Nursing note and vitals reviewed.  Assessment:   1. Postpartum care and examination   2. General counselling and advice on contraception    Plan:   Patient will see Asher Muir today Pap not due, had normal pap 10/2017 Undecided on birth control, considering Nexplanon, detailed info given today   Thressa Sheller DNP, CNM  06/11/18  3:18 PM

## 2018-06-11 NOTE — BH Specialist Note (Signed)
Integrated Behavioral Health Initial Visit  MRN: [id number] Name: Traci Carney  Number of Integrated Behavioral Health Clinician visits:: 1/6 Session Start time: 3:29  Session End time: 4:04 Total time: 30 minutes  Type of Service: Integrated Behavioral Health- Individual/Family Interpretor:No. Interpretor Name and Language: n/a   Warm Hand Off Completed.       SUBJECTIVE: Traci Carney is a 23 y.o. female accompanied by n/a Patient was referred by Thressa Sheller, CNM for positive depression screen. Patient reports the following symptoms/concerns: Pt states her primary concern today is feeling overwhelmed and "like a bad mom" because her first baby has colic; pt felt best when able to sleep 5-6 hours when her mother visited. Pt is very open to learning coping strategies to calm herself and baby.  Duration of problem: Postpartum; Severity of problem: moderate  OBJECTIVE: Mood: Anxious and Affect: Appropriate Risk of harm to self or others: No plan to harm self or others  LIFE CONTEXT: Family and Social: Pt lives with newborn son; pt's mother is her greatest support School/Work: Pt will return to work at 3 months postpartum Self-Care: Pt recognizing a greater need for self-care Life Changes: Recent childbirth  GOALS ADDRESSED: Patient will: 1. Reduce symptoms of: anxiety, depression and stress 2. Increase knowledge and/or ability of: coping skills and stress reduction  3. Demonstrate ability to: Increase healthy adjustment to current life circumstances and Increase adequate support systems for patient/family  INTERVENTIONS: Interventions utilized: Mindfulness or Management consultant and Psychoeducation and/or Health Education  Standardized Assessments completed: Edinburgh Postnatal Depression  ASSESSMENT: Patient currently experiencing Adjustment disorder with mixed anxious and depressed mood.   Patient may benefit from psychoeducation and brief therapeutic interventions  regarding coping with symptoms of anxiety and depression .  PLAN: 1. Follow up with behavioral health clinician on : As requested 2. Behavioral recommendations:  -Obtain baby swaddler today -Watch Happiest Baby on the Block online video today; implement suggestions tonight -CALM relaxation breathing exercise twice daily(morning; evening) -Begin using sleep sounds at bedtime, for improved family sleep -Attend at least one [employer] online mom support group within the next month -Allow friends and family to help (use strategies discussed) as much as possible within the next month -Read educational materials regarding coping with symptoms of anxiety and depression  3. Referral(s): Integrated [employer] [facility] (In Clinic)   Valetta Close [place], [place]  Depression screen [facility] 2/9 05/12/2018 05/05/2018 04/21/2018 04/07/2018 03/20/2018  Decreased Interest 1 0 0 0 1  Down, Depressed, Hopeless 0 0 0 0 0  PHQ - 2 Score 1 0 0 0 1  Altered sleeping 1 1 0 0 1  Tired, decreased energy 1 1 1 1 1   Change in appetite 1 1 1 1  0  Feeling bad or failure about yourself  0 0 0 0 0  Trouble concentrating 0 0 0 0 0  Moving slowly or fidgety/restless 0 0 0 0 0  Suicidal thoughts 0 0 0 0 0  PHQ-9 Score 4 3 2 2 3   Some recent data might be hidden   GAD 7 : Generalized Anxiety Score 05/12/2018 05/05/2018 04/21/2018 04/07/2018  Nervous, Anxious, on Edge 0 0 0 0  Control/stop worrying 0 0 0 0  Worry too much - different things 0 0 0 0  Trouble relaxing 0 0 0 0  Restless 0 0 0 0  Easily annoyed or irritable 1 1 1 1   Afraid - awful might happen 0 0 0 0  Total GAD 7 Score 1  1 1 1          

## 2018-06-11 NOTE — Patient Instructions (Signed)
Contraception Choices  Contraception, also called birth control, refers to methods or devices that prevent pregnancy.  Hormonal methods  Contraceptive implant    A contraceptive implant is a thin, plastic tube that contains a hormone. It is inserted into the upper part of the arm. It can remain in place for up to 3 years.  Progestin-only injections  Progestin-only injections are injections of progestin, a synthetic form of the hormone progesterone. They are given every 3 months by a health care provider.  Birth control pills    Birth control pills are pills that contain hormones that prevent pregnancy. They must be taken once a day, preferably at the same time each day.  Birth control patch    The birth control patch contains hormones that prevent pregnancy. It is placed on the skin and must be changed once a week for three weeks and removed on the fourth week. A prescription is needed to use this method of contraception.  Vaginal ring    A vaginal ring contains hormones that prevent pregnancy. It is placed in the vagina for three weeks and removed on the fourth week. After that, the process is repeated with a new ring. A prescription is needed to use this method of contraception.  Emergency contraceptive  Emergency contraceptives prevent pregnancy after unprotected sex. They come in pill form and can be taken up to 5 days after sex. They work best the sooner they are taken after having sex. Most emergency contraceptives are available without a prescription. This method should not be used as your only form of birth control.  Barrier methods  Female condom    A female condom is a thin sheath that is worn over the penis during sex. Condoms keep sperm from going inside a woman's body. They can be used with a spermicide to increase their effectiveness. They should be disposed after a single use.  Female condom    A female condom is a soft, loose-fitting sheath that is put into the vagina before sex. The condom keeps sperm  from going inside a woman's body. They should be disposed after a single use.  Diaphragm    A diaphragm is a soft, dome-shaped barrier. It is inserted into the vagina before sex, along with a spermicide. The diaphragm blocks sperm from entering the uterus, and the spermicide kills sperm. A diaphragm should be left in the vagina for 6-8 hours after sex and removed within 24 hours.  A diaphragm is prescribed and fitted by a health care provider. A diaphragm should be replaced every 1-2 years, after giving birth, after gaining more than 15 lb (6.8 kg), and after pelvic surgery.  Cervical cap    A cervical cap is a round, soft latex or plastic cup that fits over the cervix. It is inserted into the vagina before sex, along with spermicide. It blocks sperm from entering the uterus. The cap should be left in place for 6-8 hours after sex and removed within 48 hours. A cervical cap must be prescribed and fitted by a health care provider. It should be replaced every 2 years.  Sponge    A sponge is a soft, circular piece of polyurethane foam with spermicide on it. The sponge helps block sperm from entering the uterus, and the spermicide kills sperm. To use it, you make it wet and then insert it into the vagina. It should be inserted before sex, left in for at least 6 hours after sex, and removed and thrown away within   30 hours.  Spermicides  Spermicides are chemicals that kill or block sperm from entering the cervix and uterus. They can come as a cream, jelly, suppository, foam, or tablet. A spermicide should be inserted into the vagina with an applicator at least 10-15 minutes before sex to allow time for it to work. The process must be repeated every time you have sex. Spermicides do not require a prescription.  Intrauterine contraception  Intrauterine device (IUD)  An IUD is a T-shaped device that is put in a woman's uterus. There are two types:   Hormone IUD.This type contains progestin, a synthetic form of the hormone  progesterone. This type can stay in place for 3-5 years.   Copper IUD.This type is wrapped in copper wire. It can stay in place for 10 years.    Permanent methods of contraception  Female tubal ligation  In this method, a woman's fallopian tubes are sealed, tied, or blocked during surgery to prevent eggs from traveling to the uterus.  Hysteroscopic sterilization  In this method, a small, flexible insert is placed into each fallopian tube. The inserts cause scar tissue to form in the fallopian tubes and block them, so sperm cannot reach an egg. The procedure takes about 3 months to be effective. Another form of birth control must be used during those 3 months.  Female sterilization  This is a procedure to tie off the tubes that carry sperm (vasectomy). After the procedure, the man can still ejaculate fluid (semen).  Natural planning methods  Natural family planning  In this method, a couple does not have sex on days when the woman could become pregnant.  Calendar method  This means keeping track of the length of each menstrual cycle, identifying the days when pregnancy can happen, and not having sex on those days.  Ovulation method  In this method, a couple avoids sex during ovulation.  Symptothermal method  This method involves not having sex during ovulation. The woman typically checks for ovulation by watching changes in her temperature and in the consistency of cervical mucus.  Post-ovulation method  In this method, a couple waits to have sex until after ovulation.  Summary   Contraception, also called birth control, means methods or devices that prevent pregnancy.   Hormonal methods of contraception include implants, injections, pills, patches, vaginal rings, and emergency contraceptives.   Barrier methods of contraception can include female condoms, female condoms, diaphragms, cervical caps, sponges, and spermicides.   There are two types of IUDs (intrauterine devices). An IUD can be put in a woman's uterus to  prevent pregnancy for 3-5 years.   Permanent sterilization can be done through a procedure for males, females, or both.   Natural family planning methods involve not having sex on days when the woman could become pregnant.  This information is not intended to replace advice given to you by your health care provider. Make sure you discuss any questions you have with your health care provider.  Document Released: 03/18/2005 Document Revised: 03/20/2017 Document Reviewed: 04/20/2016  Elsevier Interactive Patient Education  2019 [employer]

## 2018-06-17 ENCOUNTER — Ambulatory Visit: Payer: Self-pay | Admitting: Medical

## 2018-06-18 ENCOUNTER — Telehealth: Payer: Self-pay | Admitting: Clinical

## 2018-06-18 NOTE — Telephone Encounter (Signed)
Follow-up call; baby's colic is more manageable, and mom and baby's sleep is improving. Pt says "thank you for your support"; has no concerns today. Pt is aware she may call back to [facility], as needed, for future appointments.

## 2018-08-03 ENCOUNTER — Emergency Department (HOSPITAL_BASED_OUTPATIENT_CLINIC_OR_DEPARTMENT_OTHER)
Admission: EM | Admit: 2018-08-03 | Discharge: 2018-08-03 | Disposition: A | Discharge status: Home / Self Care | Payer: Medicaid Other | Attending: Emergency Medicine | Admitting: Emergency Medicine

## 2018-08-03 ENCOUNTER — Other Ambulatory Visit: Payer: Self-pay

## 2018-08-03 ENCOUNTER — Encounter (HOSPITAL_BASED_OUTPATIENT_CLINIC_OR_DEPARTMENT_OTHER): Payer: Self-pay | Admitting: Emergency Medicine

## 2018-08-03 DIAGNOSIS — O99345 Other mental disorders complicating the puerperium: Secondary | ICD-10-CM

## 2018-08-03 DIAGNOSIS — F53 Postpartum depression: Secondary | ICD-10-CM | POA: Diagnosis not present

## 2018-08-03 DIAGNOSIS — F410 Panic disorder [episodic paroxysmal anxiety] without agoraphobia: Secondary | ICD-10-CM | POA: Diagnosis not present

## 2018-08-03 DIAGNOSIS — Z87891 Personal history of nicotine dependence: Secondary | ICD-10-CM | POA: Diagnosis not present

## 2018-08-03 DIAGNOSIS — R51 Headache: Secondary | ICD-10-CM | POA: Diagnosis present

## 2018-08-03 LAB — URINALYSIS, ROUTINE W REFLEX MICROSCOPIC
Bilirubin Urine: NEGATIVE
Glucose, UA: NEGATIVE mg/dL
Hgb urine dipstick: NEGATIVE
Ketones, ur: NEGATIVE mg/dL
Leukocytes,Ua: NEGATIVE
Nitrite: NEGATIVE
Protein, ur: NEGATIVE mg/dL
Specific Gravity, Urine: <1.005 — ABNORMAL LOW (ref 1.005–1.030)
pH: 7.5 (ref 5.0–8.0)

## 2018-08-03 LAB — PREGNANCY, URINE: Preg Test, Ur: NEGATIVE

## 2018-08-03 MED ORDER — HYDROXYZINE HCL 25 MG PO TABS: 25.0000 mg | ORAL_TABLET | Freq: Four times a day (QID) | ORAL | 0 refills | Status: DC | PRN

## 2018-08-03 MED ORDER — LORAZEPAM 1 MG PO TABS
1.0000 mg | ORAL_TABLET | Freq: Once | ORAL | Status: AC
  Administered 2018-08-03: 1 mg via ORAL
  Filled 2018-08-03: qty 1

## 2018-08-03 NOTE — ED Provider Notes (Signed)
[facility] EMERGENCY DEPARTMENT Provider Note   CSN: [id number] Arrival date & time: 08/03/18  2021    History   Chief Complaint Chief Complaint  Patient presents with  . Headache    HPI Traci Carney is a 23 y.o. female.     Patient presents to the emergency department for evaluation of chest pain.  Patient states "I thought I was having a heart attack earlier".  She has been having intermittent episodes where she gets a heaviness in her chest, shortness of breath, increased heart rate followed by anxiety and feeling of dread.  She feels like she is going to die.  She reports that she gets numbness and tingling in her hands and around her mouth.  Her primary doctor once told her that she was having panic attacks.  She does not have any medication to take when this occurs and she has never seen a counselor anybody for her anxiety.     History reviewed. No pertinent past medical history.  Patient Active Problem List   Diagnosis Date Noted  . Encounter for induction of labor 05/13/2018  . IUGR (intrauterine growth restriction) affecting care of mother 05/13/2018  . Gestational hypertension 05/12/2018  . Trauma during pregnancy 04/08/2018  . Supervision of low-risk first pregnancy 10/30/2017  . 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 HERNIA REPAIR    . TONSILLECTOMY       OB History    Gravida  1   Para  1   Term  1   Preterm      AB      Living  1     SAB      TAB      Ectopic      Multiple  0   Live Births  1            Home Medications    Prior to Admission medications   Medication Sig Start Date End Date Taking? Authorizing Provider  amLODipine (NORVASC) 10 MG tablet Take 1 tablet (10 mg total) by mouth daily. Patient not taking: Reported on 06/11/2018 05/16/18   Tilda Burrow, MD  hydrOXYzine (ATARAX/VISTARIL) 25 MG tablet Take 1-2 tablets (25-50 mg total) by mouth every 6 (six)  hours as needed for anxiety. 08/03/18   Gilda Crease, MD  ibuprofen (ADVIL,MOTRIN) 600 MG tablet Take 1 tablet (600 mg total) by mouth every 6 (six) hours. Patient not taking: Reported on 06/11/2018 05/16/18   Tilda Burrow, MD  Prenatal Vit-Fe Fumarate-FA (PRENATAL COMPLETE) 14-0.4 MG TABS Take 1 tablet by mouth daily after breakfast. Patient not taking: Reported on 06/11/2018 11/27/17   Judeth Horn, NP    Family History Family History  Problem Relation Age of Onset  . Hypertension Mother     Social History Social History   Tobacco Use  . Smoking status: Former Games developer  . Smokeless tobacco: Never Used  Substance Use Topics  . Alcohol use: Not Currently    Comment: occasionally   . Drug use: No     Allergies   Patient has no known allergies.   Review of Systems Review of Systems  Respiratory: Positive for chest tightness and shortness of breath.   Cardiovascular: Positive for palpitations.  Psychiatric/Behavioral: The patient is nervous/anxious.   All other systems reviewed and are negative.    Physical Exam Updated Vital Signs BP (!) 132/94 (BP Location: Right Arm)   Pulse 79  Temp 98.1 F (36.7 C) (Oral)   Resp 19   Ht 5\' 6"  (1.676 m)   Wt 80.7 kg   LMP 07/16/2017   SpO2 100%   BMI 28.73 kg/m   Physical Exam Vitals signs and nursing note reviewed.  Constitutional:      General: She is not in acute distress.    Appearance: Normal appearance. She is well-developed.  HENT:     Head: Normocephalic and atraumatic.     Right Ear: Hearing normal.     Left Ear: Hearing normal.     Nose: Nose normal.  Eyes:     Conjunctiva/sclera: Conjunctivae normal.     Pupils: Pupils are equal, round, and reactive to light.  Neck:     Musculoskeletal: Normal range of motion and neck supple.  Cardiovascular:     Rate and Rhythm: Regular rhythm.     Heart sounds: S1 normal and S2 normal. No murmur. No friction rub. No gallop.   Pulmonary:     Effort:  Pulmonary effort is normal. No respiratory distress.     Breath sounds: Normal breath sounds.  Chest:     Chest wall: No tenderness.  Abdominal:     General: Bowel sounds are normal.     Palpations: Abdomen is soft.     Tenderness: There is no abdominal tenderness. There is no guarding or rebound. Negative signs include Murphy's sign and McBurney's sign.     Hernia: No hernia is present.  Musculoskeletal: Normal range of motion.  Skin:    General: Skin is warm and dry.     Findings: No rash.  Neurological:     Mental Status: She is alert and oriented to person, place, and time.     GCS: GCS eye subscore is 4. GCS verbal subscore is 5. GCS motor subscore is 6.     Cranial Nerves: No cranial nerve deficit.     Sensory: No sensory deficit.     Coordination: Coordination normal.  Psychiatric:        Mood and Affect: Mood is anxious.        Speech: Speech normal.        Behavior: Behavior normal.        Thought Content: Thought content normal.      ED Treatments / Results  Labs (all labs ordered are listed, but only abnormal results are displayed) Labs Reviewed  URINALYSIS, ROUTINE W REFLEX MICROSCOPIC - Abnormal; Notable for the following components:      Result Value   Specific Gravity, Urine <1.005 (*)    All other components within normal limits  PREGNANCY, URINE    EKG EKG Interpretation  Date/Time:  Monday Aug 03 2018 20:30:26 EDT Ventricular Rate:  86 PR Interval:  128 QRS Duration: 78 QT Interval:  356 QTC Calculation: 426 R Axis:   66 Text Interpretation:  Normal sinus rhythm Normal ECG Confirmed by Gilda Crease [phone]) on 08/03/2018 10:58:17 PM   Radiology No results found.  Procedures Procedures (including critical care time)  Medications Ordered in ED Medications  LORazepam (ATIVAN) tablet 1 mg (has no administration in time range)     Initial Impression / Assessment and Plan / ED Course  I have reviewed the triage vital signs and the  nursing notes.  Pertinent labs & imaging results that were available during my care of the patient were reviewed by me and considered in my medical decision making (see chart for details).  Patient is very anxious upon my examination.  She is slightly tearful at times.  Her current presentation is consistent with panic attack.  She has been experiencing episodes of chest tightness, shortness of breath, palpitations with hyperventilation syndrome.  Patient is 2 months postpartum.  She has had an increase in her symptoms since she gave birth.  She reports that she has been feeling "the blues" but is not homicidal or suicidal.  Is any further work-up as her presentation is very classic for panic attack.  Examination is otherwise unremarkable.  Given Ativan here in the ER, will prescribe Vistaril as needed, given resources for follow-up for mental health.  Final Clinical Impressions(s) / ED Diagnoses   Final diagnoses:  Panic attack  Post partum depression    ED Discharge Orders         Ordered    hydrOXYzine (ATARAX/VISTARIL) 25 MG tablet  Every 6 hours PRN     08/03/18 2317           Gilda CreasePollina,  J, MD 08/03/18 2317

## 2018-08-03 NOTE — ED Triage Notes (Signed)
Pt c/o HA all day long with cp and numbness on her bilateral hands, pt is very anxious on triage. Per pt states last time she had this happening to her the doctor told her she was having a panic attack.

## 2018-08-03 NOTE — ED Notes (Addendum)
Pt was rushed in the front door by mother as she was screaming. Pt was noted to be hyperventilating and c/o numbness in bilateral hands. Pt was coached on slow deep breathing and was able to be distracted from hyperventilations. Pt calmed and taken to Triage 1.

## 2018-08-31 HISTORY — PX: DILATION AND CURETTAGE OF UTERUS: SHX78

## 2018-11-24 ENCOUNTER — Telehealth: Payer: Self-pay | Admitting: Family Medicine

## 2018-11-24 NOTE — Telephone Encounter (Signed)
Called pt to assess discharge. Pt reports she is having foul discharge that is white and clumpy. She has no itching or burning to vaginal area. She reports she has been having back pain for 3 days and hurts when she pees.   Pt had an Abortion in June. She has a history of BV and yeast. Pt appt was moved to Monday 8/31 at 9 am. Pt was informed if she feels worse before Monday she needs to go to an Urgent Care for evaluation. Pt voiced understanding.

## 2018-11-24 NOTE — Telephone Encounter (Signed)
Patient has an appointment to get tested, and also has complaints of a discharge. Requesting a call for a Rx until she can get in the office.

## 2018-11-27 ENCOUNTER — Telehealth: Payer: Self-pay | Admitting: Family Medicine

## 2018-11-27 ENCOUNTER — Encounter (HOSPITAL_BASED_OUTPATIENT_CLINIC_OR_DEPARTMENT_OTHER): Payer: Self-pay

## 2018-11-27 ENCOUNTER — Other Ambulatory Visit: Payer: Self-pay

## 2018-11-27 ENCOUNTER — Emergency Department (HOSPITAL_BASED_OUTPATIENT_CLINIC_OR_DEPARTMENT_OTHER)
Admission: EM | Admit: 2018-11-27 | Discharge: 2018-11-27 | Disposition: A | Discharge status: Home / Self Care | Payer: Medicaid Other | Attending: Emergency Medicine | Admitting: Emergency Medicine

## 2018-11-27 DIAGNOSIS — N10 Acute tubulo-interstitial nephritis: Secondary | ICD-10-CM | POA: Insufficient documentation

## 2018-11-27 DIAGNOSIS — B9689 Other specified bacterial agents as the cause of diseases classified elsewhere: Secondary | ICD-10-CM

## 2018-11-27 DIAGNOSIS — N760 Acute vaginitis: Secondary | ICD-10-CM | POA: Insufficient documentation

## 2018-11-27 DIAGNOSIS — Z87891 Personal history of nicotine dependence: Secondary | ICD-10-CM | POA: Diagnosis not present

## 2018-11-27 DIAGNOSIS — Z79899 Other long term (current) drug therapy: Secondary | ICD-10-CM | POA: Insufficient documentation

## 2018-11-27 DIAGNOSIS — N899 Noninflammatory disorder of vagina, unspecified: Secondary | ICD-10-CM | POA: Diagnosis present

## 2018-11-27 DIAGNOSIS — N12 Tubulo-interstitial nephritis, not specified as acute or chronic: Secondary | ICD-10-CM

## 2018-11-27 LAB — URINALYSIS, MICROSCOPIC (REFLEX)

## 2018-11-27 LAB — URINALYSIS, ROUTINE W REFLEX MICROSCOPIC
Bilirubin Urine: NEGATIVE
Glucose, UA: NEGATIVE mg/dL
Hgb urine dipstick: NEGATIVE
Ketones, ur: NEGATIVE mg/dL
Nitrite: NEGATIVE
Protein, ur: NEGATIVE mg/dL
Specific Gravity, Urine: 1.02 (ref 1.005–1.030)
pH: 7 (ref 5.0–8.0)

## 2018-11-27 LAB — WET PREP, GENITAL
Sperm: NONE SEEN
Trich, Wet Prep: NONE SEEN
Yeast Wet Prep HPF POC: NONE SEEN

## 2018-11-27 LAB — PREGNANCY, URINE: Preg Test, Ur: NEGATIVE

## 2018-11-27 MED ORDER — CEPHALEXIN 500 MG PO CAPS: 500.0000 mg | ORAL_CAPSULE | Freq: Four times a day (QID) | ORAL | 0 refills | Status: AC

## 2018-11-27 MED ORDER — METRONIDAZOLE 500 MG PO TABS: 500.0000 mg | ORAL_TABLET | Freq: Two times a day (BID) | ORAL | 0 refills | Status: AC

## 2018-11-27 NOTE — ED Triage Notes (Signed)
Pt c/o vaginal d/c, dysuria, lower back pain-sx started 2 weeks ago-NAD-steady gait

## 2018-11-27 NOTE — ED Provider Notes (Signed)
[facility] EMERGENCY DEPARTMENT Provider Note   CSN: [id number] Arrival date & time: 11/27/18  1549     History   Chief Complaint Chief Complaint  Patient presents with  . Vaginal Discharge    HPI Traci Carney is a 23 y.o. female who presents for evaluation of dysuria, increased urinary frequency, vaginal discharge that is been ongoing for several weeks.  She states that she finally came in today for evaluation because she started having some lower back pain that she describes as a "achiness."  She states that she has not noticed any hematuria but has noted over the last couple days, she has had more increased frequency in urination.  She states she has not had any fevers, nausea/vomiting, abdominal pain.  She also reports that she has had some thick, white vaginal discharge.  She states she is currently sexually active with one partner.  They do not use protection.  She states she is not concerned for STDs.     The history is provided by the patient.    History reviewed. No pertinent past medical history.  Patient Active Problem List   Diagnosis Date Noted  . Encounter for induction of labor 05/13/2018  . IUGR (intrauterine growth restriction) affecting care of mother 05/13/2018  . Gestational hypertension 05/12/2018  . Trauma during pregnancy 04/08/2018  . Supervision of low-risk first pregnancy 10/30/2017  . 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 HERNIA REPAIR    . TONSILLECTOMY       OB History    Gravida  1   Para  1   Term  1   Preterm      AB      Living  1     SAB      TAB      Ectopic      Multiple  0   Live Births  1            Home Medications    Prior to Admission medications   Medication Sig Start Date End Date Taking? Authorizing Provider  amLODipine (NORVASC) 10 MG tablet Take 1 tablet (10 mg total) by mouth daily. Patient not taking: Reported on 06/11/2018 05/16/18    Tilda BurrowFerguson, John V, MD  cephALEXin (KEFLEX) 500 MG capsule Take 1 capsule (500 mg total) by mouth 4 (four) times daily for 7 days. 11/27/18 12/04/18  Maxwell CaulLayden,  A, PA-C  hydrOXYzine (ATARAX/VISTARIL) 25 MG tablet Take 1-2 tablets (25-50 mg total) by mouth every 6 (six) hours as needed for anxiety. 08/03/18   Gilda CreasePollina, Christopher J, MD  ibuprofen (ADVIL,MOTRIN) 600 MG tablet Take 1 tablet (600 mg total) by mouth every 6 (six) hours. Patient not taking: Reported on 06/11/2018 05/16/18   Tilda BurrowFerguson, John V, MD  metroNIDAZOLE (FLAGYL) 500 MG tablet Take 1 tablet (500 mg total) by mouth 2 (two) times daily for 7 days. 11/27/18 12/04/18  Maxwell CaulLayden,  A, PA-C  Prenatal Vit-Fe Fumarate-FA (PRENATAL COMPLETE) 14-0.4 MG TABS Take 1 tablet by mouth daily after breakfast. Patient not taking: Reported on 06/11/2018 11/27/17   Judeth HornLawrence, Erin, NP    Family History Family History  Problem Relation Age of Onset  . Hypertension Mother     Social History Social History   Tobacco Use  . Smoking status: Former Games developermoker  . Smokeless tobacco: Never Used  Substance Use Topics  . Alcohol use: Yes    Comment: occasionally   . Drug use: No  Allergies   Patient has no known allergies.   Review of Systems Review of Systems  Constitutional: Negative for fever.  Respiratory: Negative for cough and shortness of breath.   Cardiovascular: Negative for chest pain.  Gastrointestinal: Negative for abdominal pain, nausea and vomiting.  Genitourinary: Positive for dysuria, frequency and vaginal discharge. Negative for hematuria.  All other systems reviewed and are negative.    Physical Exam Updated Vital Signs BP 124/70 (BP Location: Right Arm)   Pulse 78   Temp 99.3 F (37.4 C) (Oral)   Resp 16   Ht 5\' 6"  (1.676 m)   Wt 75.3 kg   LMP 11/10/2018   SpO2 99%   BMI 26.79 kg/m   Physical Exam Vitals signs and nursing note reviewed. Exam conducted with a chaperone present.  Constitutional:      Appearance:  Normal appearance. She is well-developed.  HENT:     Head: Normocephalic and atraumatic.  Eyes:     General: Lids are normal. No scleral icterus.       Right eye: No discharge.        Left eye: No discharge.     Conjunctiva/sclera: Conjunctivae normal.     Pupils: Pupils are equal, round, and reactive to light.  Neck:     Musculoskeletal: Full passive range of motion without pain.  Cardiovascular:     Rate and Rhythm: Normal rate and regular rhythm.     Pulses: Normal pulses.     Heart sounds: Normal heart sounds. No murmur. No friction rub. No gallop.   Pulmonary:     Effort: Pulmonary effort is normal.     Breath sounds: Normal breath sounds.  Abdominal:     Palpations: Abdomen is soft. Abdomen is not rigid.     Tenderness: There is no abdominal tenderness. There is right CVA tenderness and left CVA tenderness. There is no guarding.     Comments: Abdomen is soft, non-distended, non-tender. No rigidity, No guarding. No peritoneal signs.  Mild bilateral CVA tenderness.  Genitourinary:    Vagina: Vaginal discharge present.     Cervix: No cervical motion tenderness.     Uterus: Normal.      Adnexa:        Right: No mass or tenderness.         Left: No mass or tenderness.       Comments: The exam was performed with a chaperone present. Normal external female genitalia. No lesions, rash, or sores.  White discharge in vaginal vault.  Cervix was with some friability and erythema. Cervical os is closed.  No adnexal mass or tenderness noted bilaterally.  No CMT. Musculoskeletal: Normal range of motion.  Skin:    General: Skin is warm and dry.     Capillary Refill: Capillary refill takes less than 2 seconds.  Neurological:     Mental Status: She is alert and oriented to person, place, and time.  Psychiatric:        Speech: Speech normal.        Behavior: Behavior normal.      ED Treatments / Results  Labs (all labs ordered are listed, but only abnormal results are displayed) Labs  Reviewed  WET PREP, GENITAL - Abnormal; Notable for the following components:      Result Value   Clue Cells Wet Prep HPF POC PRESENT (*)    WBC, Wet Prep HPF POC MANY (*)    All other components within normal limits  URINALYSIS, ROUTINE W REFLEX  MICROSCOPIC - Abnormal; Notable for the following components:   APPearance CLOUDY (*)    Leukocytes,Ua MODERATE (*)    All other components within normal limits  URINALYSIS, MICROSCOPIC (REFLEX) - Abnormal; Notable for the following components:   Bacteria, UA MANY (*)    All other components within normal limits  URINE CULTURE  PREGNANCY, URINE  GC/CHLAMYDIA PROBE AMP ([place]) NOT AT [facility]    EKG None  Radiology No results found.  Procedures Procedures (including critical care time)  Medications Ordered in ED Medications - No data to display   Initial Impression / Assessment and Plan / ED Course  I have reviewed the triage vital signs and the nursing notes.  Pertinent labs & imaging results that were available during my care of the patient were reviewed by me and considered in my medical decision making (see chart for details).        23 year old female who presents for evaluation of dysuria, increased urinary frequency, lower back pain and vaginal discharge is been ongoing for several weeks.  No fevers, nausea/vomiting. Patient is afebrile, non-toxic appearing, sitting comfortably on examination table. Vital signs reviewed and stable.  On exam, she has some mild bilateral CVA tenderness.  No abdominal tenderness.  Concern for pyelonephritis versus UTI versus BV.  Plan for urine, pelvic.  Pelvic exam as documented above.  She did have thick white discharge in vaginal vault.  Additionally, there was some cervical erythema and friability.  Cervical office was closed.  No CMT that would be concerning for PID.  No adnexal mass or tenderness bilaterally.  No indication for ultrasound.  UA shows moderate leukocytes, pyuria and  bacteria.  Will plan to treat.  Wet prep is positive for clue cells.  We will plan to treat.  Urine pregnancy negative.  Discussed results with patient.  Instructed patient to follow-up with OB/GYN regarding cervical abnormality seen on today's pelvic exam. Patient with no known drug allergies.  Patient is afebrile, tolerating p.o. without any difficulty.  No indication for inpatient treatment.  Will plan with p.o. antibiotics. At this time, patient exhibits no emergent life-threatening condition that require further evaluation in ED or admission. Patient had ample opportunity for questions and discussion. All patient's questions were answered with full understanding. Strict return precautions discussed. Patient expresses understanding and agreement to plan.   Portions of this note were generated with Scientist, clinical (histocompatibility and immunogenetics). Dictation errors may occur despite best attempts at proofreading.   Final Clinical Impressions(s) / ED Diagnoses   Final diagnoses:  BV (bacterial vaginosis)  Pyelonephritis    ED Discharge Orders         Ordered    cephALEXin (KEFLEX) 500 MG capsule  4 times daily     11/27/18 1713    metroNIDAZOLE (FLAGYL) 500 MG tablet  2 times daily     11/27/18 1713           Rosana Hoes 11/27/18 1730    Charlynne Pander, MD 11/27/18 [phone]

## 2018-11-27 NOTE — Telephone Encounter (Signed)
Attempted to call patient about her appointment on 8/31 @ 9:00. No answer left voicemail instructing patient to wear a face mask for the entire appointment and no visitors are allowed during the visit. Patient instructed not to attend the appointment if she was any symptoms. Symptom list and office number left.

## 2018-11-27 NOTE — Discharge Instructions (Signed)
Take antibiotics as directed. Please take all of your antibiotics until finished.  Take Flagyl as directed.  It is very important that you do not consume any alcohol while taking this medication as it will cause you to become violently ill.  You have been tested today for gonorrhea and chlamydia.  If there is any abnormalities, you will be notified.  If it is negative, you will most likely not receive a call.  He could always check your results online.  As we discussed, there was some skin changes noted on your cervix.  This needs to be followed up with OB/GYN and have a repeat pelvic exam and possible Pap smear.  Return the emergency department for any fever, worsening pain, vomiting or any other worsening or concerning symptoms.

## 2018-11-28 LAB — URINE CULTURE

## 2018-11-30 ENCOUNTER — Ambulatory Visit: Payer: Medicaid Other

## 2018-12-01 LAB — GC/CHLAMYDIA PROBE AMP (~~LOC~~) NOT AT ARMC
Chlamydia: NEGATIVE
Neisseria Gonorrhea: NEGATIVE

## 2018-12-16 ENCOUNTER — Ambulatory Visit: Payer: Medicaid Other

## 2019-01-11 ENCOUNTER — Encounter: Payer: Self-pay | Admitting: Obstetrics and Gynecology

## 2019-01-11 ENCOUNTER — Ambulatory Visit (INDEPENDENT_AMBULATORY_CARE_PROVIDER_SITE_OTHER): Payer: Medicaid Other | Admitting: Obstetrics and Gynecology

## 2019-01-11 ENCOUNTER — Other Ambulatory Visit: Payer: Self-pay

## 2019-01-11 VITALS — BP 128/78 | HR 63 | Temp 98.4°F | Wt 166.1 lb

## 2019-01-11 DIAGNOSIS — N889 Noninflammatory disorder of cervix uteri, unspecified: Secondary | ICD-10-CM

## 2019-01-11 NOTE — Progress Notes (Signed)
Obstetrics and Gynecology Visit Return Patient Evaluation  Appointment Date: 01/11/2019  Primary Care Provider: Philip for [facility]  Chief Complaint: ED follow up  History of Present Illness:  Traci Carney is a 23 y.o. G2P1011 (LMP: just started) with above CC.   Patient went to ED on 8/28 for vag discharge. Wet prep showed clue cells, ucx neg and gc/ct swab neg. She was told that they saw some "skin changes on her cervix" and was recommended she follow up with GYN. ED note doesn't mention any abnormalities on physical exam.  Pt currently asymptomatic  Pap neg 2019  Patient had D&C, elective AB in June 2020. She's had qmonth, regular periods since then.   Review of Systems:  as noted in the History of Present Illness.  Medications:  None  Allergies: has No Known Allergies.  Physical Exam:  BP 128/78   Pulse 63   Temp 98.4 F (36.9 C)   Wt 166 lb 1.6 oz (75.3 kg)   BMI 26.81 kg/m  Body mass index is 26.81 kg/m. General appearance: Well nourished, well developed female in no acute distress.  Abdomen: diffusely non tender to palpation, non distended, and no masses, hernias Neuro/Psych:  Normal mood and affect.    Pelvic exam:  EGBUS, vaginal vault and cervix: within normal limits. Mild amount of old blood, no active bleeding.  Bimanual: negative   Assessment: pt doing well  Plan:  1. Cervical lesion Unsure what was concern but normal exam, ROS and prior pap smear.   Patient not interested in anything for birth control.  RTC: PRN  Durene Romans MD Attending Center for [facility])

## 2019-04-05 ENCOUNTER — Other Ambulatory Visit: Payer: Self-pay

## 2019-04-05 ENCOUNTER — Emergency Department (HOSPITAL_BASED_OUTPATIENT_CLINIC_OR_DEPARTMENT_OTHER)
Admission: EM | Admit: 2019-04-05 | Discharge: 2019-04-05 | Disposition: A | Discharge status: Home / Self Care | Payer: Medicaid Other | Attending: Emergency Medicine | Admitting: Emergency Medicine

## 2019-04-05 ENCOUNTER — Encounter (HOSPITAL_BASED_OUTPATIENT_CLINIC_OR_DEPARTMENT_OTHER): Payer: Self-pay

## 2019-04-05 DIAGNOSIS — Z5321 Procedure and treatment not carried out due to patient leaving prior to being seen by health care provider: Secondary | ICD-10-CM | POA: Insufficient documentation

## 2019-04-05 DIAGNOSIS — R05 Cough: Secondary | ICD-10-CM | POA: Diagnosis present

## 2019-04-05 NOTE — ED Triage Notes (Signed)
Pt c/o "covid symptoms" x 3 days with +covid contact 03/25/2018-NAD-steady gait

## 2019-04-05 NOTE — ED Notes (Signed)
Pt states she is leaving after advised of wait time-NAD

## 2019-06-12 ENCOUNTER — Other Ambulatory Visit: Payer: Self-pay

## 2019-06-12 ENCOUNTER — Emergency Department (HOSPITAL_BASED_OUTPATIENT_CLINIC_OR_DEPARTMENT_OTHER)
Admission: EM | Admit: 2019-06-12 | Discharge: 2019-06-12 | Disposition: A | Discharge status: Home / Self Care | Payer: Medicaid Other | Attending: Emergency Medicine | Admitting: Emergency Medicine

## 2019-06-12 ENCOUNTER — Encounter (HOSPITAL_BASED_OUTPATIENT_CLINIC_OR_DEPARTMENT_OTHER): Payer: Self-pay | Admitting: Emergency Medicine

## 2019-06-12 DIAGNOSIS — Z5321 Procedure and treatment not carried out due to patient leaving prior to being seen by health care provider: Secondary | ICD-10-CM | POA: Insufficient documentation

## 2019-06-12 DIAGNOSIS — R300 Dysuria: Secondary | ICD-10-CM | POA: Diagnosis not present

## 2019-06-12 LAB — URINALYSIS, ROUTINE W REFLEX MICROSCOPIC
Bilirubin Urine: NEGATIVE
Glucose, UA: NEGATIVE mg/dL
Hgb urine dipstick: NEGATIVE
Ketones, ur: NEGATIVE mg/dL
Leukocytes,Ua: NEGATIVE
Nitrite: NEGATIVE
Protein, ur: NEGATIVE mg/dL
Specific Gravity, Urine: 1.02 (ref 1.005–1.030)
pH: 7 (ref 5.0–8.0)

## 2019-06-12 LAB — PREGNANCY, URINE: Preg Test, Ur: POSITIVE — AB

## 2019-06-12 NOTE — ED Triage Notes (Signed)
Dysuria, back pain and vaginal discharge x 1 week.

## 2019-07-11 ENCOUNTER — Encounter (HOSPITAL_COMMUNITY): Payer: Self-pay | Admitting: Obstetrics & Gynecology

## 2019-07-11 ENCOUNTER — Inpatient Hospital Stay (HOSPITAL_COMMUNITY)
Admission: EM | Admit: 2019-07-11 | Discharge: 2019-07-11 | Disposition: A | Discharge status: Home / Self Care | Payer: Medicaid Other | Attending: Obstetrics & Gynecology | Admitting: Obstetrics & Gynecology

## 2019-07-11 ENCOUNTER — Inpatient Hospital Stay (HOSPITAL_COMMUNITY): Payer: Medicaid Other

## 2019-07-11 ENCOUNTER — Other Ambulatory Visit: Payer: Self-pay

## 2019-07-11 DIAGNOSIS — N939 Abnormal uterine and vaginal bleeding, unspecified: Secondary | ICD-10-CM

## 2019-07-11 DIAGNOSIS — O039 Complete or unspecified spontaneous abortion without complication: Secondary | ICD-10-CM | POA: Diagnosis not present

## 2019-07-11 DIAGNOSIS — Z87891 Personal history of nicotine dependence: Secondary | ICD-10-CM | POA: Insufficient documentation

## 2019-07-11 DIAGNOSIS — O209 Hemorrhage in early pregnancy, unspecified: Secondary | ICD-10-CM | POA: Diagnosis present

## 2019-07-11 LAB — URINALYSIS, ROUTINE W REFLEX MICROSCOPIC
Bacteria, UA: NONE SEEN
Bilirubin Urine: NEGATIVE
Glucose, UA: NEGATIVE mg/dL
Ketones, ur: NEGATIVE mg/dL
Leukocytes,Ua: NEGATIVE
Nitrite: NEGATIVE
Protein, ur: NEGATIVE mg/dL
Specific Gravity, Urine: 1.017 (ref 1.005–1.030)
pH: 7 (ref 5.0–8.0)

## 2019-07-11 LAB — CBC
HCT: 40.4 % (ref 36.0–46.0)
Hemoglobin: 13.3 g/dL (ref 12.0–15.0)
MCH: 26.9 pg (ref 26.0–34.0)
MCHC: 32.9 g/dL (ref 30.0–36.0)
MCV: 81.6 fL (ref 80.0–100.0)
Platelets: 280 10*3/uL (ref 150–400)
RBC: 4.95 MIL/uL (ref 3.87–5.11)
RDW: 14.2 % (ref 11.5–15.5)
WBC: 8.7 10*3/uL (ref 4.0–10.5)
nRBC: 0 % (ref 0.0–0.2)

## 2019-07-11 LAB — COMPREHENSIVE METABOLIC PANEL WITH GFR
ALT: 15 U/L (ref 0–44)
AST: 15 U/L (ref 15–41)
Albumin: 3.8 g/dL (ref 3.5–5.0)
Alkaline Phosphatase: 53 U/L (ref 38–126)
Anion gap: 7 (ref 5–15)
BUN: 9 mg/dL (ref 6–20)
CO2: 23 mmol/L (ref 22–32)
Calcium: 9.4 mg/dL (ref 8.9–10.3)
Chloride: 105 mmol/L (ref 98–111)
Creatinine, Ser: 0.69 mg/dL (ref 0.44–1.00)
GFR calc Af Amer: >60 mL/min
GFR calc non Af Amer: >60 mL/min
Glucose, Bld: 84 mg/dL (ref 70–99)
Potassium: 4 mmol/L (ref 3.5–5.1)
Sodium: 135 mmol/L (ref 135–145)
Total Bilirubin: 0.3 mg/dL (ref 0.3–1.2)
Total Protein: 6.9 g/dL (ref 6.5–8.1)

## 2019-07-11 LAB — HCG, QUANTITATIVE, PREGNANCY: hCG, Beta Chain, Quant, S: 20255 m[IU]/mL — ABNORMAL HIGH

## 2019-07-11 MED ORDER — MISOPROSTOL 200 MCG PO TABS: 800.0000 ug | ORAL_TABLET | Freq: Once | ORAL | 1 refills | Status: DC

## 2019-07-11 MED ORDER — ACETAMINOPHEN-CODEINE #3 300-30 MG PO TABS: 1.0000 | ORAL_TABLET | Freq: Four times a day (QID) | ORAL | 0 refills | Status: DC | PRN

## 2019-07-11 MED ORDER — PROMETHAZINE HCL 12.5 MG PO TABS: 12.5000 mg | ORAL_TABLET | Freq: Four times a day (QID) | ORAL | 0 refills | Status: AC | PRN

## 2019-07-11 NOTE — MAU Provider Note (Signed)
Patient Traci Carney is a 24 y.o. G3P1011 at [redacted]w[redacted]d by LMP here with complaints of vaginal bleeding. She had a new OB appointment on Friday and was told that the baby was "measuring small" and had no heartbeat. She is scheduled for follow-up in one week for another [place].    She is here because she started cramping today with occasional pink spotting, but denies pain now. She denies dysuria, abdominal pain, pelvic pain, abnormal vaginal discharge.  History     CSN: [id number]  Arrival date and time: 07/11/19 1206   First Provider Initiated Contact with Patient 07/11/19 1318      Chief Complaint  Patient presents with  . Abdominal Pain  . Vaginal Bleeding   Vaginal Bleeding The patient's primary symptoms include vaginal bleeding. This is a new problem. The current episode started today. The problem occurs intermittently. The patient is experiencing no pain. Pertinent negatives include no constipation or diarrhea. There has been no bleeding. She has not been passing clots. She has not been passing tissue.    OB History     Gravida  3   Para  1   Term  1   Preterm      AB  1   Living  1      SAB      TAB      Ectopic      Multiple  0   Live Births  1        Obstetric Comments  Elective AB x 1         History reviewed. No pertinent past medical history.  Past Surgical History:  Procedure Laterality Date  . CHOLECYSTECTOMY    . DILATION AND CURETTAGE OF UTERUS  08/2018   EAB  . HERNIA REPAIR    . TONSILLECTOMY      Family History  Problem Relation Age of Onset  . Hypertension Mother     Social History   Tobacco Use  . Smoking status: Former Research scientist (life sciences)  . Smokeless tobacco: Never Used  Substance Use Topics  . Alcohol use: Yes    Comment: occasionally   . Drug use: No    Allergies: No Known Allergies  No medications prior to admission.    Review of Systems  HENT: Negative.   Respiratory: Negative.   Gastrointestinal: Negative for  constipation and diarrhea.  Genitourinary: Positive for vaginal bleeding.  Neurological: Negative.    Physical Exam   Blood pressure 125/78, pulse 72, temperature 98.4 F (36.9 C), temperature source Oral, resp. rate 16, height 5\' 6"  (1.676 m), weight 165 lb 1.6 oz (74.9 kg), last menstrual period 05/06/2019, SpO2 100 %, unknown if currently breastfeeding.  Physical Exam  Constitutional: She is oriented to person, place, and time. She appears well-developed.  HENT:  Head: Normocephalic.  Respiratory: Effort normal.  GI: Soft.  Genitourinary:    Genitourinary Comments: NEFG; dark brown blood in the vagina, no CMT, cervix is closed, long and thick. No CMT, suprapubic or adnexal tenderness.    Neurological: She is alert and oriented to person, place, and time.  Skin: Skin is warm and dry.    MAU Course  Procedures  MDM -full ectopic work-up: CBC, Beta HCG, US performed to rule out ectopic pregnancy, which can be life threatening.  -UA is negative for signs of infection, trace amount of bleeding in the vagina.  -[place] images discussed with Dr. Harolyn Rutherford, who finds that patient has a missed abortion and can choose expectant management  or medical medication.   -Patient desires cytotec.      Early Intrauterine Pregnancy Failure Protocol X  Documented intrauterine pregnancy failure less than or equal to [redacted] weeks   gestation  X  No serious current illness  X  Baseline Hgb greater than or equal to 10g/dl  X  Patient has easily accessible transportation to the hospital  X  Clear preference  X  Practitioner/physician deems patient reliable  X  Counseling by practitioner or physician  X  Patient education by RN  X  Consent form signed       Rho-Gam given by RN if indicated  X  Medication dispensed  X  Cytotec 800 mcg Intravaginally or orally by patient at home       Intravaginally by NP in MAU       Rectally by patient at home       Rectally by RN in MAU  X   Ibuprofen 600 mg 1 tablet by  mouth every 6 hours as needed #30 - prescribed  X   Tylenol #3 mg by mouth every 4 to 6 hours as needed - prescribed  X   Phenergan 12.5 mg by mouth every 4 hours as needed for nausea - prescribed  Reviewed with pt cytotec procedure.  Pt verbalizes that she lives close to the hospital and has transportation readily available.  Pt appears reliable and verbalizes understanding and agrees with plan of care     Assessment and Plan   1. Miscarriage   2. Vaginal bleeding    2. Patient does not know if she will take cytotec tonight or not; she will wait and decide.  Instructions on managing pain, vomiting given. Patient stable for discharge with bleeding precautions and follow up plan at [facility] this Thursday.   Charlesetta Garibaldi Prentiss Polio 07/11/2019, 4:32 PM

## 2019-07-11 NOTE — ED Provider Notes (Signed)
G2P1 by [facility] OB presents for evaluation of vaginal bleeding.  Approximately [redacted] weeks pregnant by dates.  Followed by [facility], in [place].  Was seen on Friday 07/09/19 and told she was likely having a miscarriage.  Patient presents today due to worsening lower abdominal pain and vaginal bleeding.  She denies any lightheadedness or dizziness.  ROS- Abd pain, vaginal bleeding  PE; Gen- tearful Heart and lungs clear Abdomen-soft nontender GU deferred Skin no pallor Neuro ambulatory without difficulty  Consult with Denny Peon, MAU provider.  She agrees to accept patient in transfer for further work-up of nominal pain vaginal bleeding in setting of pregnancy.  Prior chart review patient with positive pregnancy test in epic, care everywhere with [facility]. Rh+  Hemodynamically stable in triage.   The patient appears reasonably stabilized for admission considering the current resources, flow, and capabilities available in the ED at this time, and I doubt any other [facility] requiring further screening and/or treatment in the ED prior to admission.  MSE was initiated and I personally evaluated the patient and placed orders (if any) at  11:52 AM on July 11, 2019.  The patient appears stable so that the remainder of the MSE may be completed by another provider.   Broox Lonigro A, PA-C 07/11/19 1152    Virgina Norfolk, DO 07/11/19 1540

## 2019-07-11 NOTE — MAU Note (Signed)
Traci Carney is a 24 y.o. at [redacted]w[redacted]d here in MAU reporting: on Friday she went to the office and u/s was measuring a couple weeks behind her period dating. Today had some pink bleeding. Is wearing a pad and it has small amount of pink bleeding. no clots. Having some cramping, mostly on the right side of her abdomen.   Onset of complaint: today  Pain score: 5/10  Vitals:   07/11/19 1147 07/11/19 1216  BP: (!) 145/84 126/70  Pulse: 94 71  Resp: 18 16  Temp: 98.2 F (36.8 C) 98.4 F (36.9 C)  SpO2: 100% 100%     Lab orders placed from triage: UA

## 2019-07-11 NOTE — Discharge Instructions (Signed)

## 2019-11-09 ENCOUNTER — Other Ambulatory Visit: Payer: Self-pay

## 2019-11-09 ENCOUNTER — Emergency Department (HOSPITAL_BASED_OUTPATIENT_CLINIC_OR_DEPARTMENT_OTHER)
Admission: EM | Admit: 2019-11-09 | Discharge: 2019-11-09 | Disposition: A | Discharge status: Home / Self Care | Payer: Medicaid Other | Attending: Emergency Medicine | Admitting: Emergency Medicine

## 2019-11-09 ENCOUNTER — Encounter (HOSPITAL_BASED_OUTPATIENT_CLINIC_OR_DEPARTMENT_OTHER): Payer: Self-pay | Admitting: Emergency Medicine

## 2019-11-09 DIAGNOSIS — R197 Diarrhea, unspecified: Secondary | ICD-10-CM | POA: Diagnosis not present

## 2019-11-09 DIAGNOSIS — R0981 Nasal congestion: Secondary | ICD-10-CM | POA: Insufficient documentation

## 2019-11-09 DIAGNOSIS — R5383 Other fatigue: Secondary | ICD-10-CM | POA: Diagnosis not present

## 2019-11-09 DIAGNOSIS — Z20822 Contact with and (suspected) exposure to covid-19: Secondary | ICD-10-CM | POA: Insufficient documentation

## 2019-11-09 DIAGNOSIS — K120 Recurrent oral aphthae: Secondary | ICD-10-CM | POA: Diagnosis not present

## 2019-11-09 DIAGNOSIS — Z87891 Personal history of nicotine dependence: Secondary | ICD-10-CM | POA: Diagnosis not present

## 2019-11-09 DIAGNOSIS — J029 Acute pharyngitis, unspecified: Secondary | ICD-10-CM | POA: Diagnosis present

## 2019-11-09 LAB — GROUP A STREP BY PCR: Group A Strep by PCR: NOT DETECTED

## 2019-11-09 LAB — SARS CORONAVIRUS 2 BY RT PCR (HOSPITAL ORDER, PERFORMED IN ~~LOC~~ HOSPITAL LAB): SARS Coronavirus 2: NEGATIVE

## 2019-11-09 MED ORDER — LIDOCAINE VISCOUS HCL 2 % MT SOLN: 15.0000 mL | OROMUCOSAL | 0 refills | Status: DC | PRN

## 2019-11-09 NOTE — ED Provider Notes (Signed)
[facility] EMERGENCY DEPARTMENT Provider Note   CSN: [id number] Arrival date & time: 11/09/19  1609     History Chief Complaint  Patient presents with  . Sore Throat    Traci Carney is a 24 y.o. female.  HPI  Patient is a 24 year old female with no pertinent past medical history presented today for sore throat for approximately 3 days.  Denies any fevers or chills.  She denies any difficulty swallowing but states that it is 8/10 sharp pain whenever she swallows or eats.  Patient is not vaccinated for Covid.  Denies any myalgias, nausea vomiting or abdominal pain.  She does endorse some mild diarrhea.  She states she has also felt somewhat fatigued last few days.  She states she has only had 2 or 3 episodes of diarrhea.  No bloody or mucousy diarrhea.  She states she has been eating and drinking fine however has been urinating without difficulty.  Has not had any chest pain or palpitations or shortness of breath.  She states that her child is home from daycare currently which is been so stressful for her.  She states that there has been no known Covid exposure at his daycare.  She states that her son is without any symptoms.    Patient denies any ear pain but states that she feels somewhat congested in her ears and face.  History reviewed. No pertinent past medical history.  Patient Active Problem List   Diagnosis Date Noted  . Encounter for induction of labor 05/13/2018  . IUGR (intrauterine growth restriction) affecting care of mother 05/13/2018  . Gestational hypertension 05/12/2018  . Trauma during pregnancy 04/08/2018  . Supervision of low-risk first pregnancy 10/30/2017  . 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 CHOLECYSTECTOMY    . DILATION AND CURETTAGE OF UTERUS  08/2018   EAB  . HERNIA REPAIR    . TONSILLECTOMY       OB History    Gravida  3   Para  1   Term  1   Preterm      AB  1   Living  1       SAB      TAB      Ectopic      Multiple  0   Live Births  1        Obstetric Comments  Elective AB x 1        Family History  Problem Relation Age of Onset  . Hypertension Mother     Social History   Tobacco Use  . Smoking status: Former Games developer  . Smokeless tobacco: Never Used  Vaping Use  . Vaping Use: Never used  Substance Use Topics  . Alcohol use: Yes    Comment: occasionally   . Drug use: No    Home Medications Prior to Admission medications   Medication Sig Start Date End Date Taking? Authorizing Provider  acetaminophen-codeine (TYLENOL #3) 300-30 MG tablet Take 1-2 tablets by mouth every 6 (six) hours as needed for moderate pain. 07/11/19   Marylene Land, CNM  amLODipine (NORVASC) 10 MG tablet Take 1 tablet (10 mg total) by mouth daily. Patient not taking: Reported on 06/11/2018 05/16/18   Tilda Burrow, MD  hydrOXYzine (ATARAX/VISTARIL) 25 MG tablet Take 1-2 tablets (25-50 mg total) by mouth every 6 (six) hours as needed for anxiety. Patient not taking: Reported on 01/11/2019 08/03/18   Jaci Carrel  J, MD  ibuprofen (ADVIL,MOTRIN) 600 MG tablet Take 1 tablet (600 mg total) by mouth every 6 (six) hours. Patient not taking: Reported on 06/11/2018 05/16/18   Tilda Burrow, MD  lidocaine (XYLOCAINE) 2 % solution Use as directed 15 mLs in the mouth or throat as needed (throat pain). 11/09/19   Gailen Shelter, PA  misoprostol (CYTOTEC) 200 MCG tablet Take 4 tablets (800 mcg total) by mouth once for 1 dose. Place two tablets on each cheek next to gumlines. Repeat in 48 hours if no bleeding. 07/11/19 07/11/19  Marylene Land, CNM  Prenatal Vit-Fe Fumarate-FA (PRENATAL COMPLETE) 14-0.4 MG TABS Take 1 tablet by mouth daily after breakfast. Patient not taking: Reported on 06/11/2018 11/27/17   Judeth Horn, NP  promethazine (PHENERGAN) 12.5 MG tablet Take 1 tablet (12.5 mg total) by mouth every 6 (six) hours as needed for nausea or  vomiting. 07/11/19   Marylene Land, CNM    Allergies    Patient has no known allergies.  Review of Systems   Review of Systems  Constitutional: Positive for fatigue. Negative for chills and fever.  HENT: Positive for rhinorrhea and sore throat. Negative for congestion.   Eyes: Negative for pain.  Respiratory: Negative for cough and shortness of breath.   Cardiovascular: Negative for chest pain and leg swelling.  Gastrointestinal: Positive for diarrhea. Negative for abdominal pain, nausea and vomiting.  Genitourinary: Negative for dysuria.  Musculoskeletal: Negative for myalgias.  Skin: Negative for rash.  Neurological: Negative for dizziness and headaches.    Physical Exam Updated Vital Signs BP 119/89 (BP Location: Left Arm)   Pulse 100   Temp 98.5 F (36.9 C) (Oral)   Resp 16   Ht 5\' 6"  (1.676 m)   Wt 72.3 kg   LMP 11/04/2019   SpO2 99%   Breastfeeding Unknown   BMI 25.73 kg/m   Physical Exam Vitals and nursing note reviewed.  Constitutional:      General: She is not in acute distress.    Appearance: Normal appearance. She is not ill-appearing.  HENT:     Head: Normocephalic and atraumatic.     Nose: No rhinorrhea.     Mouth/Throat:     Mouth: Mucous membranes are moist.     Comments: Posterior pharynx with multiple scattered 1 cm in diameter aphthous ulcers.  No posterior pharynx erythema, no trismus, no exudate or tonsillar hypertrophy. Eyes:     General: No scleral icterus.       Right eye: No discharge.        Left eye: No discharge.     Conjunctiva/sclera: Conjunctivae normal.  Neck:     Comments: Scant mildly tender to cervical lymphadenopathy bilaterally. Cardiovascular:     Rate and Rhythm: Normal rate.     Pulses: Normal pulses.     Heart sounds: Normal heart sounds.     Comments: Heart rate 86 on my exam Pulmonary:     Effort: Pulmonary effort is normal.     Breath sounds: Normal breath sounds. No stridor.  Skin:    General: Skin  is warm and dry.     Capillary Refill: Capillary refill takes less than 2 seconds.  Neurological:     Mental Status: She is alert and oriented to person, place, and time. Mental status is at baseline.  Psychiatric:        Mood and Affect: Mood normal.        Behavior: Behavior normal.     ED  Results / Procedures / Treatments   Labs (all labs ordered are listed, but only abnormal results are displayed) Labs Reviewed  GROUP A STREP BY PCR  SARS CORONAVIRUS 2 BY RT PCR (HOSPITAL ORDER, PERFORMED IN [facility])    EKG None  Radiology No results found.  Procedures Procedures (including critical care time)  Medications Ordered in ED Medications - No data to display  ED Course  I have reviewed the triage vital signs and the nursing notes.  Pertinent labs & imaging results that were available during my care of the patient were reviewed by me and considered in my medical decision making (see chart for details).    MDM Rules/Calculators/A&P                           Patient is a 24 year old female with no pertinent past medical history presented today for sore throat and some head congestion.  She is reassuring physical exam does have notable aphthous ulcers in the posterior pharynx.  Strep test was negative.  Will swab for Covid given that she has possible exposure by her son who is asymptomatic but was exposed to Covid.  She has not been vaccinated.  Patient is overall well-appearing.  She is not tachycardic my examination nor she hypoxic or tachypneic.  Blood pressure is within normal limits.  Heart lungs are clear she is overall healthy appearing.  Patient given return precautions Covid test pending at this time.  Will provide patient with viscous lidocaine to gargle for symptomatic control.  She will also use Cepacol to help numb her sore throat.  The pain is midline and no signs of pta.  Pt is non toxic and no lymphadenopathy to suggest RPA,  Possible strep so  will obtain rapid test. No signs of dehydration to suggest need for IVF.   No barky cough to suggest croup.    ROYELLE HINCHMAN was evaluated in Emergency Department on 11/09/2019 for the symptoms described in the history of present illness. She was evaluated in the context of the global COVID-19 pandemic, which necessitated consideration that the patient might be at risk for infection with the SARS-CoV-2 virus that causes COVID-19. Institutional protocols and algorithms that pertain to the evaluation of patients at risk for COVID-19 are in a state of rapid change based on information released by regulatory bodies including the CDC and federal and state organizations. These policies and algorithms were followed during the patient's care in the ED.  Final Clinical Impression(s) / ED Diagnoses Final diagnoses:  Oral aphthous ulcer  Head congestion    Rx / DC Orders ED Discharge Orders         Ordered    lidocaine (XYLOCAINE) 2 % solution  As needed     Discontinue  Reprint     11/09/19 1846           Gailen Shelter, PA 11/09/19 Bennie Hind    Arby Barrette, MD 11/18/19 [phone]

## 2019-11-09 NOTE — Discharge Instructions (Signed)
Please drink plenty of water.  Please use/gargle with the viscous lidocaine which I provided to you.  I also provided you with betamethasone syrup which you may use once daily which will help with the inflammation and irritation of the canker sores.  I have also tested you for Covid.  Please read the attached information below about Covid.  Your COVID test is pending; you should expect results in 3-4 hours. You can access your results on your MyChart--if you test positive you should receive a phone call.  In the meantime follow CDC guidelines and quarantine, wear a mask, wash hands often.   Please take over the counter vitamin D 2000-4000 units per day. I also recommend zinc 50 mg per day for the next two weeks.   Please return to ED if you feel have difficulty breathing or have emergent, new or concerning symptoms.  Patients who have symptoms consistent with COVID-19 should self isolated for: At least 3 days (72 hours) have passed since recovery, defined as resolution of fever without the use of fever reducing medications and improvement in respiratory symptoms (e.g., cough, shortness of breath), and At least 7 days have passed since symptoms first appeared.       Person Under Monitoring Name: Traci Carney  Location: [address] [place] [address]   Infection Prevention Recommendations for Individuals Confirmed to have, or Being Evaluated for, 2019 Novel Coronavirus (COVID-19) Infection Who Receive Care at Home  Individuals who are confirmed to have, or are being evaluated for, COVID-19 should follow the prevention steps below until a healthcare provider or local or state health department says they can return to normal activities.  Stay home except to get medical care You should restrict activities outside your home, except for getting medical care. Do not go to work, school, or public areas, and do not use public transportation or taxis.  Call ahead before  visiting your doctor Before your medical appointment, call the healthcare provider and tell them that you have, or are being evaluated for, COVID-19 infection. This will help the healthcare provider's office take steps to keep other people from getting infected. Ask your healthcare provider to call the local or state health department.  Monitor your symptoms Seek prompt medical attention if your illness is worsening (e.g., difficulty breathing). Before going to your medical appointment, call the healthcare provider and tell them that you have, or are being evaluated for, COVID-19 infection. Ask your healthcare provider to call the local or state health department.  Wear a facemask You should wear a facemask that covers your nose and mouth when you are in the same room with other people and when you visit a healthcare provider. People who live with or visit you should also wear a facemask while they are in the same room with you.  Separate yourself from other people in your home As much as possible, you should stay in a different room from other people in your home. Also, you should use a separate bathroom, if available.  Avoid sharing household items You should not share dishes, drinking glasses, cups, eating utensils, towels, bedding, or other items with other people in your home. After using these items, you should wash them thoroughly with soap and water.  Cover your coughs and sneezes Cover your mouth and nose with a tissue when you cough or sneeze, or you can cough or sneeze into your sleeve. Throw used tissues in a lined trash can, and immediately wash  your hands with soap and water for at least 20 seconds or use an alcohol-based hand rub.  Wash your [employer] your hands often and thoroughly with soap and water for at least 20 seconds. You can use an alcohol-based hand sanitizer if soap and water are not available and if your hands are not visibly dirty. Avoid touching your eyes,  nose, and mouth with unwashed hands.   Prevention Steps for Caregivers and Household Members of Individuals Confirmed to have, or Being Evaluated for, COVID-19 Infection Being Cared for in the Home  If you live with, or provide care at home for, a person confirmed to have, or being evaluated for, COVID-19 infection please follow these guidelines to prevent infection:  Follow healthcare provider's instructions Make sure that you understand and can help the patient follow any healthcare provider instructions for all care.  Provide for the patient's basic needs You should help the patient with basic needs in the home and provide support for getting groceries, prescriptions, and other personal needs.  Monitor the patient's symptoms If they are getting sicker, call his or her medical provider and tell them that the patient has, or is being evaluated for, COVID-19 infection. This will help the healthcare provider's office take steps to keep other people from getting infected. Ask the healthcare provider to call the local or state health department.  Limit the number of people who have contact with the patient If possible, have only one caregiver for the patient. Other household members should stay in another home or place of residence. If this is not possible, they should stay in another room, or be separated from the patient as much as possible. Use a separate bathroom, if available. Restrict visitors who do not have an essential need to be in the home.  Keep older adults, very young children, and other sick people away from the patient Keep older adults, very young children, and those who have compromised immune systems or chronic health conditions away from the patient. This includes people with chronic heart, lung, or kidney conditions, diabetes, and cancer.  Ensure good ventilation Make sure that shared spaces in the home have good air flow, such as from an air conditioner or an opened  window, weather permitting.  Wash your hands often Wash your hands often and thoroughly with soap and water for at least 20 seconds. You can use an alcohol based hand sanitizer if soap and water are not available and if your hands are not visibly dirty. Avoid touching your eyes, nose, and mouth with unwashed hands. Use disposable paper towels to dry your hands. If not available, use dedicated cloth towels and replace them when they become wet.  Wear a facemask and gloves Wear a disposable facemask at all times in the room and gloves when you touch or have contact with the patient's blood, body fluids, and/or secretions or excretions, such as sweat, saliva, sputum, nasal mucus, vomit, urine, or feces.  Ensure the mask fits over your nose and mouth tightly, and do not touch it during use. Throw out disposable facemasks and gloves after using them. Do not reuse. Wash your hands immediately after removing your facemask and gloves. If your personal clothing becomes contaminated, carefully remove clothing and launder. Wash your hands after handling contaminated clothing. Place all used disposable facemasks, gloves, and other waste in a lined container before disposing them with other household waste. Remove gloves and wash your hands immediately after handling these items.  Do not share dishes,  glasses, or other household items with the patient Avoid sharing household items. You should not share dishes, drinking glasses, cups, eating utensils, towels, bedding, or other items with a patient who is confirmed to have, or being evaluated for, COVID-19 infection. After the person uses these items, you should wash them thoroughly with soap and water.  Wash laundry thoroughly Immediately remove and wash clothes or bedding that have blood, body fluids, and/or secretions or excretions, such as sweat, saliva, sputum, nasal mucus, vomit, urine, or feces, on them. Wear gloves when handling laundry from the  patient. Read and follow directions on labels of laundry or clothing items and detergent. In general, wash and dry with the warmest temperatures recommended on the label.  Clean all areas the individual has used often Clean all touchable surfaces, such as counters, tabletops, doorknobs, bathroom fixtures, toilets, phones, keyboards, tablets, and bedside tables, every day. Also, clean any surfaces that may have blood, body fluids, and/or secretions or excretions on them. Wear gloves when cleaning surfaces the patient has come in contact with. Use a diluted bleach solution (e.g., dilute bleach with 1 part bleach and 10 parts water) or a household disinfectant with a label that says EPA-registered for coronaviruses. To make a bleach solution at home, add 1 tablespoon of bleach to 1 quart (4 cups) of water. For a larger supply, add  cup of bleach to 1 gallon (16 cups) of water. Read labels of cleaning products and follow recommendations provided on product labels. Labels contain instructions for safe and effective use of the cleaning product including precautions you should take when applying the product, such as wearing gloves or eye protection and making sure you have good ventilation during use of the product. Remove gloves and wash hands immediately after cleaning.  Monitor yourself for signs and symptoms of illness Caregivers and household members are considered close contacts, should monitor their health, and will be asked to limit movement outside of the home to the extent possible. Follow the monitoring steps for close contacts listed on the symptom monitoring form.   ? If you have additional questions, contact your local health department or call the epidemiologist on call at [phone] (available 24/7). ? This guidance is subject to change. For the most up-to-date guidance from [facility], please refer to their website: [email or web address]

## 2019-11-09 NOTE — ED Triage Notes (Signed)
Sore throat and bilateral ear pain x 3 days.

## 2020-06-21 IMAGING — US US OB TRANSVAGINAL
1 series · 15 of 28 positions shown · non-contrast
Comparison: 09/30/2017

CLINICAL DATA: Pregnancy of unknown anatomic location

EXAM:
TRANSVAGINAL OB ULTRASOUND
TECHNIQUE: Transvaginal ultrasound was performed for complete evaluation of the
gestation as well as the maternal uterus, adnexal regions, and
pelvic cul-de-sac.

[Series 1: us ob transvaginal · 15 of 44 slices shown]
[im 1/44]
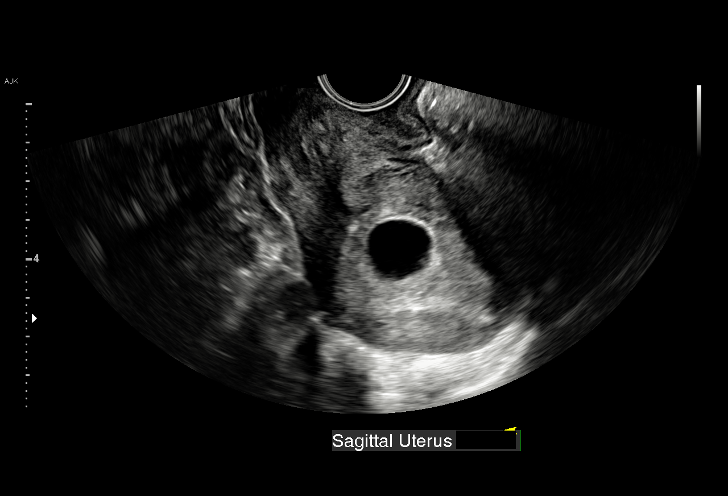
[im 4/44]
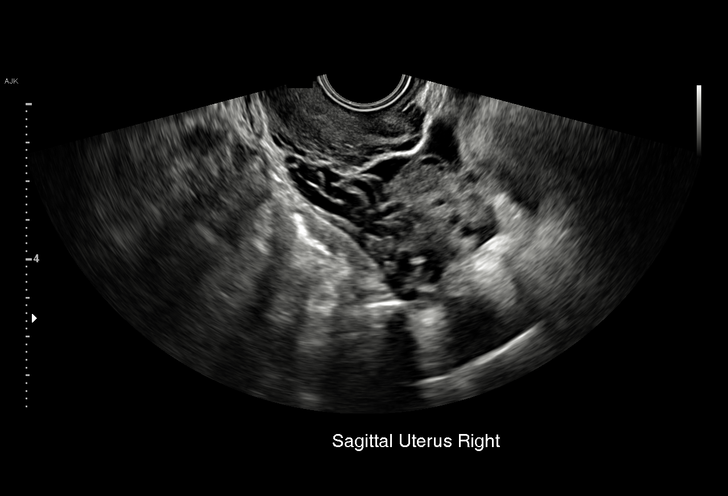
[im 7/44]
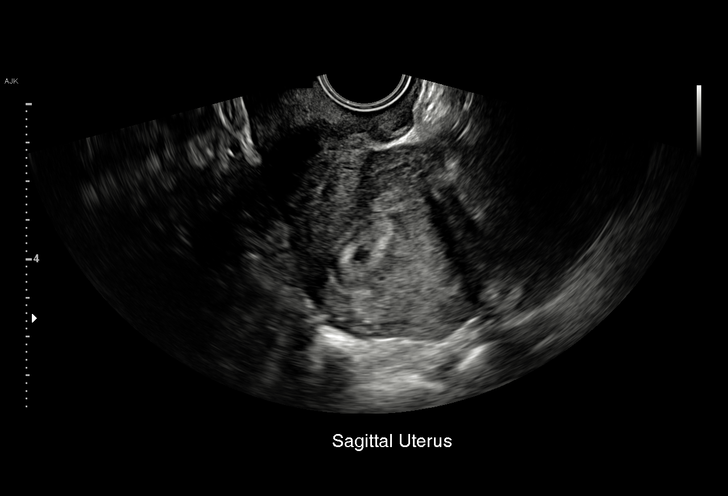
[im 10/44]
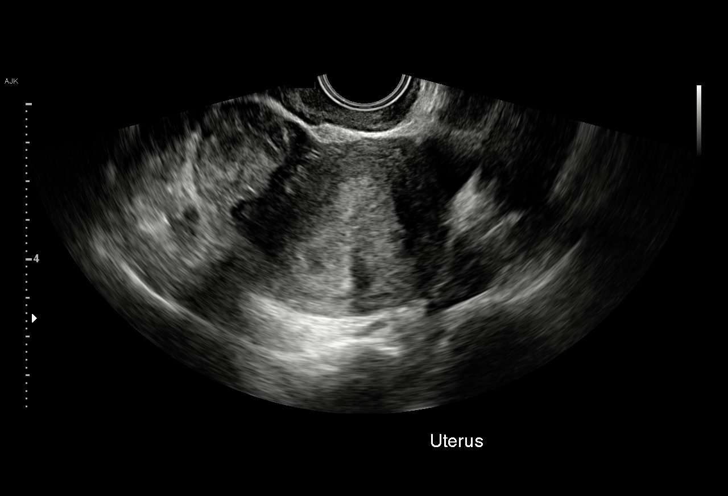
[im 13/44]
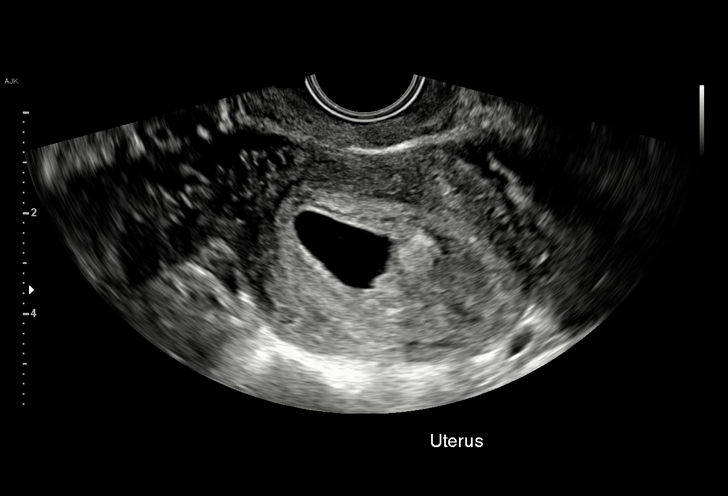
[im 16/44]
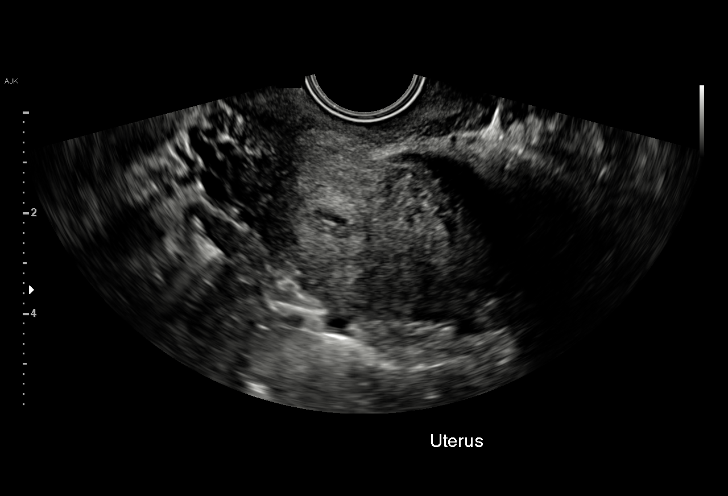
[im 20/44]
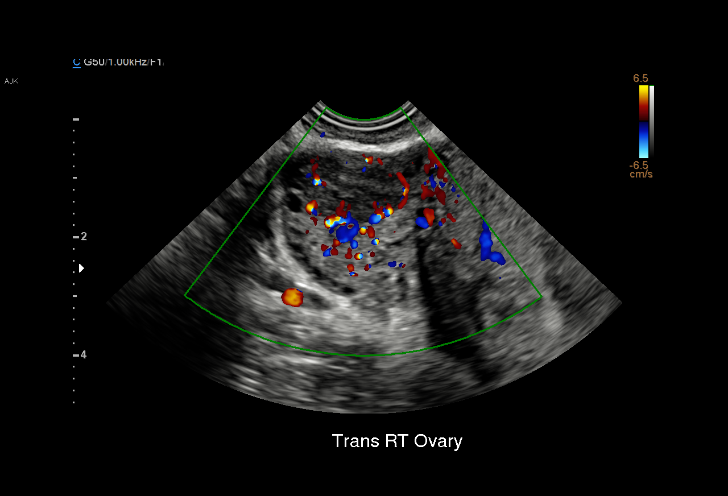
[im 23/44]
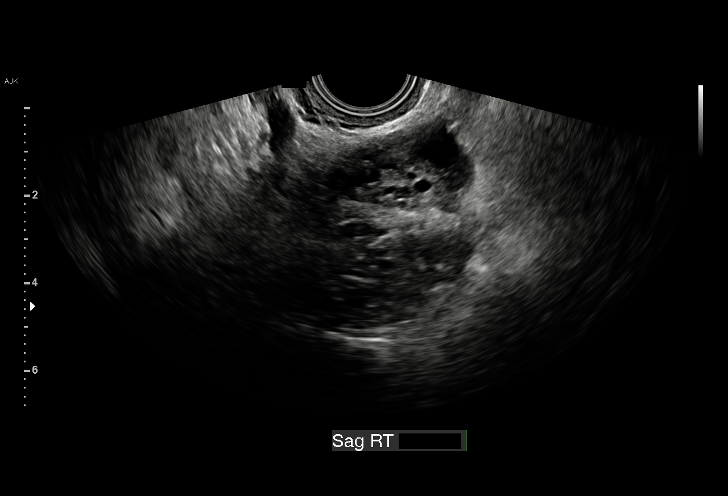
[im 24/44]
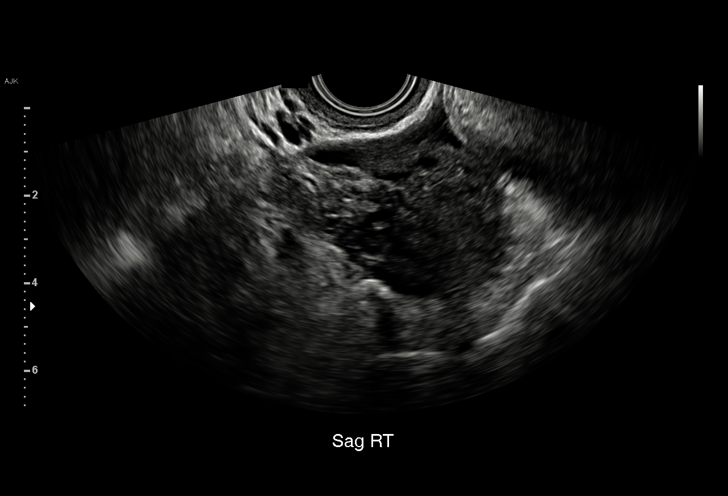
[im 28/44]
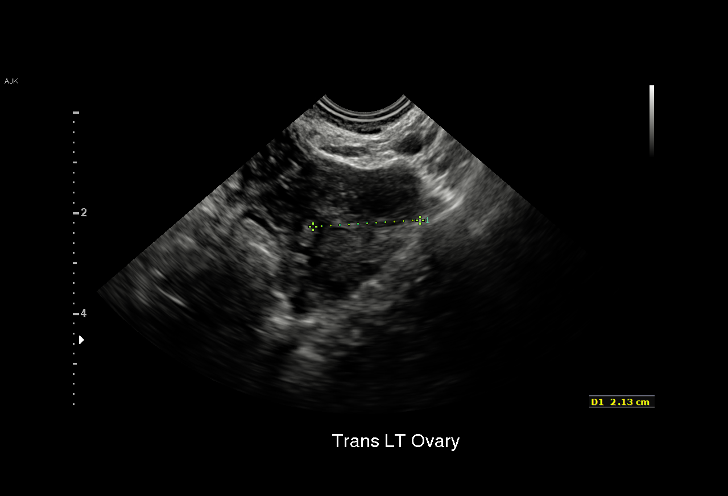
[im 31/44]
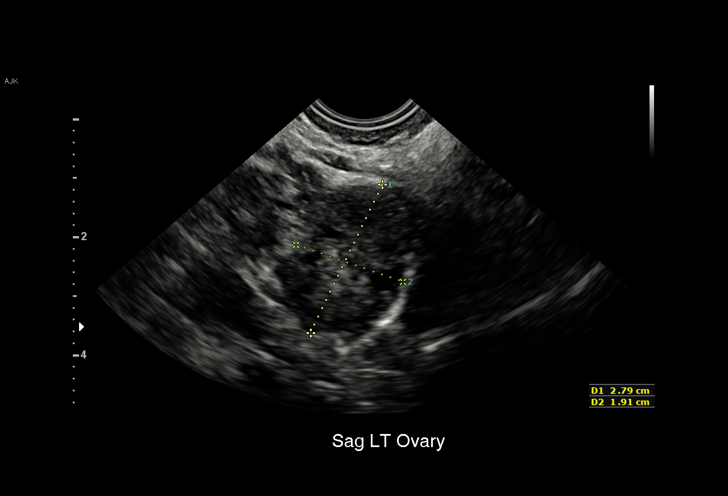
[im 34/44]
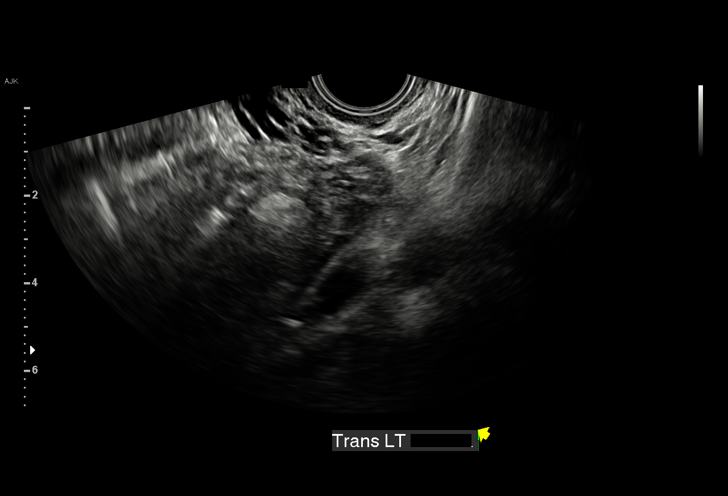
[im 37/44]
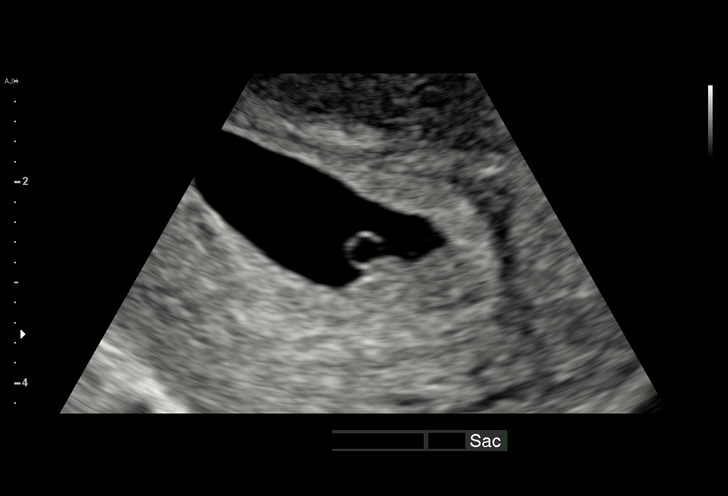
[im 40/44]
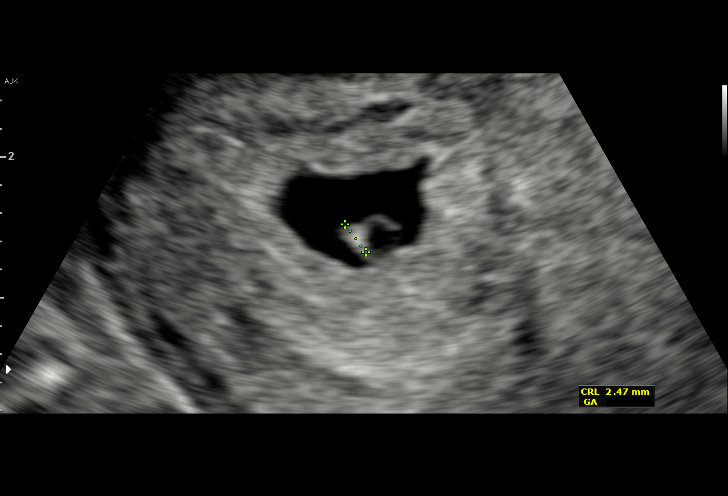
[im 44/44]
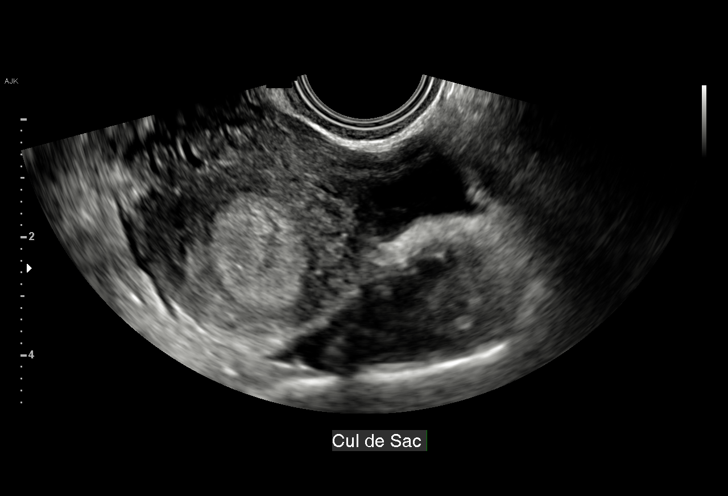

[15 of 28 positions shown; findings below may reference images not displayed]

FINDINGS: Intrauterine gestational sac: Present, single

Yolk sac:  Present

Embryo:  Present

Cardiac Activity: Present

Heart Rate: 88 bpm

CRL:   2.6 mm   5 w 5 d                  US EDC: 06/05/2018

Subchorionic hemorrhage:  None visualized.

Maternal uterus/adnexae:

RIGHT ovary normal size 4.2 x 2.8 x 2.4 cm containing a small corpus
luteum.

LEFT ovary normal size and morphology 2.8 x 1.9 x 2.1 cm.

Trace free pelvic fluid.

No adnexal masses.
IMPRESSION: Single live intrauterine gestation at 5 weeks 5 days EGA by
crown-rump length.

## 2022-01-11 ENCOUNTER — Encounter (HOSPITAL_BASED_OUTPATIENT_CLINIC_OR_DEPARTMENT_OTHER): Payer: Self-pay | Admitting: Emergency Medicine

## 2022-01-11 ENCOUNTER — Emergency Department (HOSPITAL_BASED_OUTPATIENT_CLINIC_OR_DEPARTMENT_OTHER)
Admission: EM | Admit: 2022-01-11 | Discharge: 2022-01-11 | Disposition: A | Discharge status: Home / Self Care | Payer: Medicaid Other | Attending: Emergency Medicine | Admitting: Emergency Medicine

## 2022-01-11 ENCOUNTER — Other Ambulatory Visit: Payer: Self-pay

## 2022-01-11 DIAGNOSIS — N764 Abscess of vulva: Secondary | ICD-10-CM | POA: Insufficient documentation

## 2022-01-11 DIAGNOSIS — L0291 Cutaneous abscess, unspecified: Secondary | ICD-10-CM

## 2022-01-11 LAB — URINALYSIS, MICROSCOPIC (REFLEX)

## 2022-01-11 LAB — URINALYSIS, ROUTINE W REFLEX MICROSCOPIC
Bilirubin Urine: NEGATIVE
Glucose, UA: NEGATIVE mg/dL
Ketones, ur: NEGATIVE mg/dL
Nitrite: NEGATIVE
Protein, ur: NEGATIVE mg/dL
Specific Gravity, Urine: 1.02 (ref 1.005–1.030)
pH: 7 (ref 5.0–8.0)

## 2022-01-11 LAB — PREGNANCY, URINE: Preg Test, Ur: NEGATIVE

## 2022-01-11 MED ORDER — LIDOCAINE-EPINEPHRINE (PF) 2 %-1:200000 IJ SOLN
20.0000 mL | Freq: Once | INTRAMUSCULAR | Status: AC
  Administered 2022-01-11: 20 mL
  Filled 2022-01-11: qty 20

## 2022-01-11 MED ORDER — DOXYCYCLINE HYCLATE 100 MG PO CAPS: 100.0000 mg | ORAL_CAPSULE | Freq: Two times a day (BID) | ORAL | 0 refills | Status: DC

## 2022-01-11 MED ORDER — DOXYCYCLINE HYCLATE 100 MG PO TABS
100.0000 mg | ORAL_TABLET | Freq: Once | ORAL | Status: AC
  Administered 2022-01-11: 100 mg via ORAL
  Filled 2022-01-11: qty 1

## 2022-01-11 MED ORDER — IBUPROFEN 800 MG PO TABS
800.0000 mg | ORAL_TABLET | Freq: Once | ORAL | Status: AC
  Administered 2022-01-11: 800 mg via ORAL
  Filled 2022-01-11: qty 1

## 2022-01-11 NOTE — ED Provider Notes (Signed)
[facility] EMERGENCY DEPARTMENT Provider Note   CSN: [id number] Arrival date & time: 01/11/22  3825     History  Chief Complaint  Patient presents with   Abscess    Traci Carney is a 26 y.o. female.  Patient complains of abscess to her right labia for the past 4 to 5 days.  Denies any bleeding or drainage.  Has had abscess in the past but not require drainage.  No fever.  No abdominal pain, nausea or vomiting.  No pain with urination or blood in the urine.  Does not believe she is pregnant.  The history is provided by the patient.  Abscess      Home Medications Prior to Admission medications   Medication Sig Start Date End Date Taking? Authorizing Provider  acetaminophen-codeine (TYLENOL #3) 300-30 MG tablet Take 1-2 tablets by mouth every 6 (six) hours as needed for moderate pain. 07/11/19   Marylene Land, CNM  amLODipine (NORVASC) 10 MG tablet Take 1 tablet (10 mg total) by mouth daily. Patient not taking: Reported on 06/11/2018 05/16/18   Tilda Burrow, MD  hydrOXYzine (ATARAX/VISTARIL) 25 MG tablet Take 1-2 tablets (25-50 mg total) by mouth every 6 (six) hours as needed for anxiety. Patient not taking: Reported on 01/11/2019 08/03/18   Gilda Crease, MD  ibuprofen (ADVIL,MOTRIN) 600 MG tablet Take 1 tablet (600 mg total) by mouth every 6 (six) hours. Patient not taking: Reported on 06/11/2018 05/16/18   Tilda Burrow, MD  lidocaine (XYLOCAINE) 2 % solution Use as directed 15 mLs in the mouth or throat as needed (throat pain). 11/09/19   Gailen Shelter, PA  misoprostol (CYTOTEC) 200 MCG tablet Take 4 tablets (800 mcg total) by mouth once for 1 dose. Place two tablets on each cheek next to gumlines. Repeat in 48 hours if no bleeding. 07/11/19 07/11/19  Marylene Land, CNM  Prenatal Vit-Fe Fumarate-FA (PRENATAL COMPLETE) 14-0.4 MG TABS Take 1 tablet by mouth daily after breakfast. Patient not taking: Reported on 06/11/2018 11/27/17    Judeth Horn, NP  promethazine (PHENERGAN) 12.5 MG tablet Take 1 tablet (12.5 mg total) by mouth every 6 (six) hours as needed for nausea or vomiting. 07/11/19   Marylene Land, CNM      Allergies    Patient has no known allergies.    Review of Systems   Review of Systems  Skin:  Positive for wound.   all other systems are negative except as noted in the HPI and PMH.    Physical Exam Updated Vital Signs BP (!) 126/91 (BP Location: Right Arm)   Pulse 88   Temp 98.1 F (36.7 C) (Oral)   Resp 18   SpO2 97%  Physical Exam Vitals and nursing note reviewed.  Constitutional:      General: She is not in acute distress.    Appearance: She is well-developed.  HENT:     Head: Normocephalic and atraumatic.     Mouth/Throat:     Pharynx: No oropharyngeal exudate.  Eyes:     Conjunctiva/sclera: Conjunctivae normal.     Pupils: Pupils are equal, round, and reactive to light.  Neck:     Comments: No meningismus. Cardiovascular:     Rate and Rhythm: Normal rate and regular rhythm.     Heart sounds: Normal heart sounds. No murmur heard. Pulmonary:     Effort: Pulmonary effort is normal. No respiratory distress.     Breath sounds: Normal breath sounds.  Abdominal:  Palpations: Abdomen is soft.     Tenderness: There is no abdominal tenderness. There is no guarding or rebound.  Genitourinary:    Comments: Chaperone present. Warden/ranger.  2 cm area of erythema and fluctuance to right labia majora.  No surrounding erythema. Musculoskeletal:        General: No tenderness. Normal range of motion.     Cervical back: Normal range of motion and neck supple.  Skin:    General: Skin is warm.  Neurological:     Mental Status: She is alert and oriented to person, place, and time.     Cranial Nerves: No cranial nerve deficit.     Motor: No abnormal muscle tone.     Coordination: Coordination normal.     Comments:  5/5 strength throughout. CN 2-12 intact.Equal grip strength.    Psychiatric:        Behavior: Behavior normal.     ED Results / Procedures / Treatments   Labs (all labs ordered are listed, but only abnormal results are displayed) Labs Reviewed  URINALYSIS, ROUTINE W REFLEX MICROSCOPIC - Abnormal; Notable for the following components:      Result Value   APPearance HAZY (*)    Hgb urine dipstick MODERATE (*)    Leukocytes,Ua SMALL (*)    All other components within normal limits  URINALYSIS, MICROSCOPIC (REFLEX) - Abnormal; Notable for the following components:   Bacteria, UA MANY (*)    All other components within normal limits  PREGNANCY, URINE    EKG None  Radiology No results found.  Procedures .Marland KitchenIncision and Drainage  Date/Time: 01/11/2022 10:01 AM  Performed by: Glynn Octave, MD Authorized by: Glynn Octave, MD   Consent:    Consent obtained:  Verbal   Consent given by:  Patient   Risks, benefits, and alternatives were discussed: yes     Risks discussed:  Bleeding, damage to other organs, infection, incomplete drainage and pain   Alternatives discussed:  No treatment Universal protocol:    Procedure explained and questions answered to patient or proxy's satisfaction: yes     Relevant documents present and verified: yes     Site/side marked: yes     Immediately prior to procedure, a time out was called: yes     Patient identity confirmed:  Verbally with patient Location:    Type:  Abscess   Size:  2   Location:  Anogenital   Anogenital location:  Vulva Pre-procedure details:    Skin preparation:  Povidone-iodine Anesthesia:    Anesthesia method:  Local infiltration   Local anesthetic:  Lidocaine 2% WITH epi Procedure type:    Complexity:  Simple Procedure details:    Ultrasound guidance: no     Needle aspiration: no     Incision types:  Elliptical   Incision depth:  Subcutaneous   Wound management:  Probed and deloculated and irrigated with saline   Drainage:  Purulent   Drainage amount:  Copious    Wound treatment:  Wound left open   Packing materials:  1/4 in iodoform gauze Post-procedure details:    Procedure completion:  Tolerated Comments:     Warden/ranger present through entirety of procedure.     Medications Ordered in ED Medications  lidocaine-EPINEPHrine (XYLOCAINE W/EPI) 2 %-1:200000 (PF) injection 20 mL (has no administration in time range)  ibuprofen (ADVIL) tablet 800 mg (has no administration in time range)    ED Course/ Medical Decision Making/ A&P  Medical Decision Making Amount and/or Complexity of Data Reviewed Labs: ordered. Decision-making details documented in ED Course. Radiology: ordered and independent interpretation performed. Decision-making details documented in ED Course. ECG/medicine tests: ordered and independent interpretation performed. Decision-making details documented in ED Course.  Risk Prescription drug management.  Labial abscess. Stable vitals. No distress.  Incision and drainage performed as above.  Baxter Flattery RN present for entirety of procedure.  Discussed warm soaks at home, wound care, packing removal in 2 days.  Take the antibiotics as prescribed.  HCG negative. Pyuria noted but contaminated sample. No urinary symptoms.   Return to the ED sooner for worsening pain, redness, swelling, drainage, fever or other concerns.        Final Clinical Impression(s) / ED Diagnoses Final diagnoses:  Abscess    Rx / DC Orders ED Discharge Orders     None         Amram Maya, Annie Main, MD 01/11/22 1118

## 2022-01-11 NOTE — ED Triage Notes (Signed)
Patient reports abscess x4-5 day  states that she has had abscess before

## 2022-01-11 NOTE — ED Notes (Signed)
Abscess is on rt labia  4-5 days  States that she has had abscess before .

## 2022-01-11 NOTE — Discharge Instructions (Signed)
Perform the warm soaks 2 times daily as discussed.  If the packing is still present on Sunday you can pull it out yourself.  Follow-up with your doctor for a recheck of the wound next week.  Return to the ED sooner for worsening pain, redness, drainage, fever, any other concerns.

## 2022-01-11 NOTE — ED Notes (Signed)
Patient can't void at this time 

## 2022-03-14 ENCOUNTER — Emergency Department (HOSPITAL_BASED_OUTPATIENT_CLINIC_OR_DEPARTMENT_OTHER)
Admission: EM | Admit: 2022-03-14 | Discharge: 2022-03-14 | Disposition: A | Discharge status: Home / Self Care | Payer: Medicaid Other | Attending: Emergency Medicine | Admitting: Emergency Medicine

## 2022-03-14 ENCOUNTER — Other Ambulatory Visit: Payer: Self-pay

## 2022-03-14 ENCOUNTER — Encounter (HOSPITAL_BASED_OUTPATIENT_CLINIC_OR_DEPARTMENT_OTHER): Payer: Self-pay | Admitting: Emergency Medicine

## 2022-03-14 DIAGNOSIS — N898 Other specified noninflammatory disorders of vagina: Secondary | ICD-10-CM | POA: Diagnosis not present

## 2022-03-14 DIAGNOSIS — R300 Dysuria: Secondary | ICD-10-CM | POA: Insufficient documentation

## 2022-03-14 DIAGNOSIS — D72819 Decreased white blood cell count, unspecified: Secondary | ICD-10-CM | POA: Diagnosis not present

## 2022-03-14 DIAGNOSIS — B9689 Other specified bacterial agents as the cause of diseases classified elsewhere: Secondary | ICD-10-CM

## 2022-03-14 LAB — PREGNANCY, URINE: Preg Test, Ur: NEGATIVE

## 2022-03-14 LAB — URINALYSIS, ROUTINE W REFLEX MICROSCOPIC
Bilirubin Urine: NEGATIVE
Glucose, UA: NEGATIVE mg/dL
Hgb urine dipstick: NEGATIVE
Ketones, ur: NEGATIVE mg/dL
Nitrite: NEGATIVE
Protein, ur: 30 mg/dL — AB
Specific Gravity, Urine: 1.025 (ref 1.005–1.030)
pH: 8 (ref 5.0–8.0)

## 2022-03-14 LAB — URINALYSIS, MICROSCOPIC (REFLEX)

## 2022-03-14 LAB — WET PREP, GENITAL
Sperm: NONE SEEN
Trich, Wet Prep: NONE SEEN
WBC, Wet Prep HPF POC: >=10 — AB (ref <10)
Yeast Wet Prep HPF POC: NONE SEEN

## 2022-03-14 MED ORDER — METRONIDAZOLE 500 MG PO TABS: 500.0000 mg | ORAL_TABLET | Freq: Two times a day (BID) | ORAL | 0 refills | Status: DC

## 2022-03-14 NOTE — ED Triage Notes (Signed)
Burning with urination and vaginal itching and "cottage cheese" discharge x 1 week. Denies flank pain, or fever.

## 2022-03-14 NOTE — ED Provider Notes (Signed)
[facility] EMERGENCY DEPARTMENT Provider Note   CSN: [id number] Arrival date & time: 03/14/22  1543     History  Chief Complaint  Patient presents with   Dysuria    Traci Carney is a 26 y.o. female.   Dysuria    Patient presents due to dysuria for for a few days.  Started about a week ago initially with white clumps/vaginal discharge.  She is sexually monogamous with 1 partner, denies any abdominal pain, nausea, vomiting, fevers.   Home Medications Prior to Admission medications   Medication Sig Start Date End Date Taking? Authorizing Provider  acetaminophen-codeine (TYLENOL #3) 300-30 MG tablet Take 1-2 tablets by mouth every 6 (six) hours as needed for moderate pain. 07/11/19   Marylene Land, CNM  amLODipine (NORVASC) 10 MG tablet Take 1 tablet (10 mg total) by mouth daily. Patient not taking: Reported on 06/11/2018 05/16/18   Tilda Burrow, MD  doxycycline (VIBRAMYCIN) 100 MG capsule Take 1 capsule (100 mg total) by mouth 2 (two) times daily. 01/11/22   Rancour, Jeannett Senior, MD  hydrOXYzine (ATARAX/VISTARIL) 25 MG tablet Take 1-2 tablets (25-50 mg total) by mouth every 6 (six) hours as needed for anxiety. Patient not taking: Reported on 01/11/2019 08/03/18   Gilda Crease, MD  ibuprofen (ADVIL,MOTRIN) 600 MG tablet Take 1 tablet (600 mg total) by mouth every 6 (six) hours. Patient not taking: Reported on 06/11/2018 05/16/18   Tilda Burrow, MD  lidocaine (XYLOCAINE) 2 % solution Use as directed 15 mLs in the mouth or throat as needed (throat pain). 11/09/19   Gailen Shelter, PA  misoprostol (CYTOTEC) 200 MCG tablet Take 4 tablets (800 mcg total) by mouth once for 1 dose. Place two tablets on each cheek next to gumlines. Repeat in 48 hours if no bleeding. 07/11/19 07/11/19  Marylene Land, CNM  Prenatal Vit-Fe Fumarate-FA (PRENATAL COMPLETE) 14-0.4 MG TABS Take 1 tablet by mouth daily after breakfast. Patient not taking: Reported on  06/11/2018 11/27/17   Judeth Horn, NP  promethazine (PHENERGAN) 12.5 MG tablet Take 1 tablet (12.5 mg total) by mouth every 6 (six) hours as needed for nausea or vomiting. 07/11/19   Marylene Land, CNM      Allergies    Patient has no known allergies.    Review of Systems   Review of Systems  Genitourinary:  Positive for dysuria.    Physical Exam Updated Vital Signs BP 136/80 (BP Location: Right Arm)   Pulse 78   Temp 98.3 F (36.8 C) (Oral)   Resp 18   Ht 5\' 6"  (1.676 m)   Wt 84.4 kg   SpO2 99%   BMI 30.02 kg/m  Physical Exam Vitals and nursing note reviewed. Exam conducted with a chaperone present.  Constitutional:      General: She is not in acute distress.    Appearance: Normal appearance.  HENT:     Head: Normocephalic and atraumatic.  Eyes:     General: No scleral icterus.       Right eye: No discharge.        Left eye: No discharge.     Extraocular Movements: Extraocular movements intact.     Pupils: Pupils are equal, round, and reactive to light.  Cardiovascular:     Rate and Rhythm: Normal rate and regular rhythm.     Pulses: Normal pulses.     Heart sounds: Normal heart sounds. No murmur heard.    No friction rub. No gallop.  Pulmonary:     Effort: Pulmonary effort is normal. No respiratory distress.     Breath sounds: Normal breath sounds.  Abdominal:     General: Abdomen is flat. Bowel sounds are normal. There is no distension.     Palpations: Abdomen is soft.     Tenderness: There is no abdominal tenderness.  Genitourinary:    Comments: Deferred Skin:    General: Skin is warm and dry.     Coloration: Skin is not jaundiced.  Neurological:     Mental Status: She is alert. Mental status is at baseline.     Coordination: Coordination normal.     ED Results / Procedures / Treatments   Labs (all labs ordered are listed, but only abnormal results are displayed) Labs Reviewed  URINALYSIS, ROUTINE W REFLEX MICROSCOPIC - Abnormal;  Notable for the following components:      Result Value   APPearance CLOUDY (*)    Protein, ur 30 (*)    Leukocytes,Ua SMALL (*)    All other components within normal limits  URINALYSIS, MICROSCOPIC (REFLEX) - Abnormal; Notable for the following components:   Bacteria, UA FEW (*)    All other components within normal limits  WET PREP, GENITAL  PREGNANCY, URINE    EKG None  Radiology No results found.  Procedures Procedures    Medications Ordered in ED Medications - No data to display  ED Course/ Medical Decision Making/ A&P                           Medical Decision Making Amount and/or Complexity of Data Reviewed Labs: ordered.  Risk Prescription drug management.   Patient presents due to dysuria.  Her abdominal exam is benign, she is afebrile without risk factors for PID.  She is also complaining of vaginal discharge which seems suspicious for BV causing the dysuria rather than a UTI although UTI is still a consideration.  I ordered reviewed labs.  Per my interpretation UA is with a very small amount of leukocyturia.  Patient's wet prep is notable for clue cells consistent with BV.  GC deferred on pelvic which I think is reasonable given I do not think she is PID and she is not sexually monogamous relationship.  Declined STD testing.  Will have her follow-up with her gynecologist as needed for follow-up.  Flagyl prescribed.  Return precaution discussed        Final Clinical Impression(s) / ED Diagnoses Final diagnoses:  None    Rx / DC Orders ED Discharge Orders     None         Theron Arista, PA-C 03/14/22 1750    Charlynne Pander, MD 03/14/22 2031

## 2022-03-14 NOTE — Discharge Instructions (Signed)
Take Flagyl twice daily for a week.  This will cure the BV.  Think is also the reason you are having burning when you pee.

## 2022-03-24 IMAGING — US US OB TRANSVAGINAL
1 series · 15 of 28 positions shown · non-contrast
Comparison: Obstetric ultrasound 06/12/2019

CLINICAL DATA: Vaginal bleeding. First trimester pregnancy. LMP
05/06/2019. Quantitative beta HCG level [DATE].

EXAM:
OBSTETRIC <14 WK US AND TRANSVAGINAL OB US
TECHNIQUE: Both transabdominal and transvaginal ultrasound examinations were
performed for complete evaluation of the gestation as well as the
maternal uterus, adnexal regions, and pelvic cul-de-sac.
Transvaginal technique was performed to assess early pregnancy.

[Series 1: us ob transvaginal · 58 acquisitions, 15 frames shown]
[im 1/58]
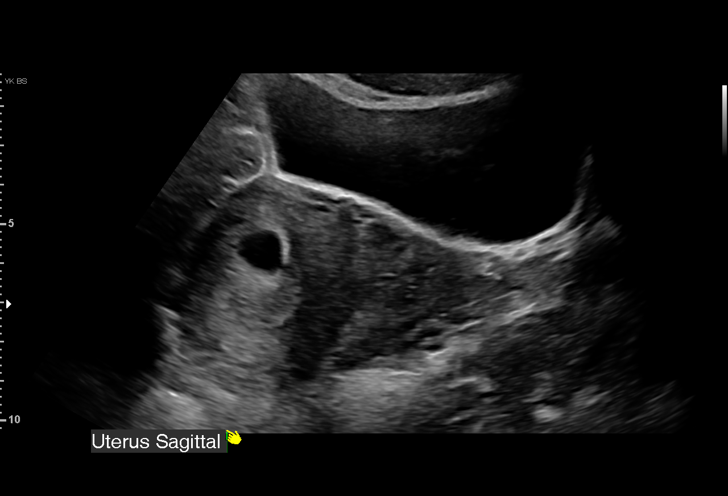
[im 5/58]
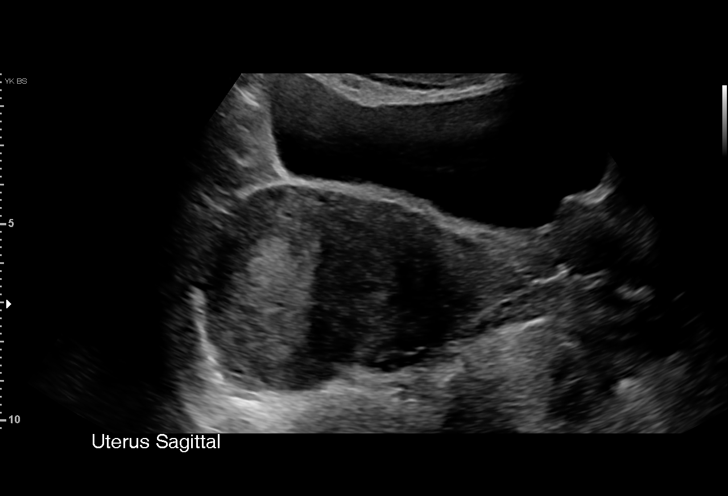
[im 9/58]
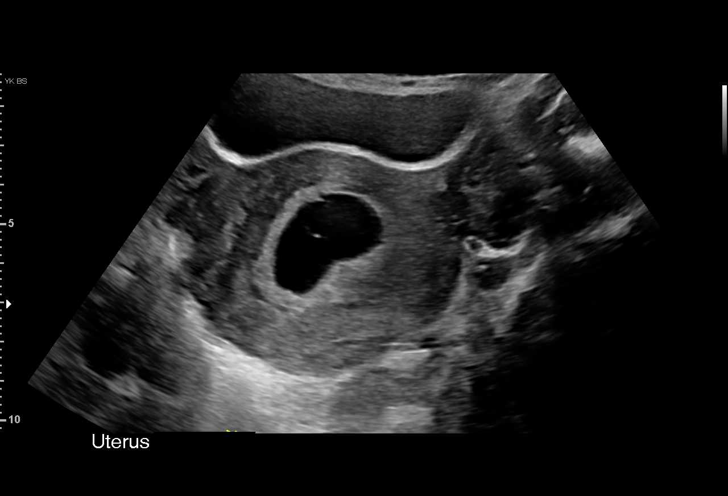
[im 13/58]
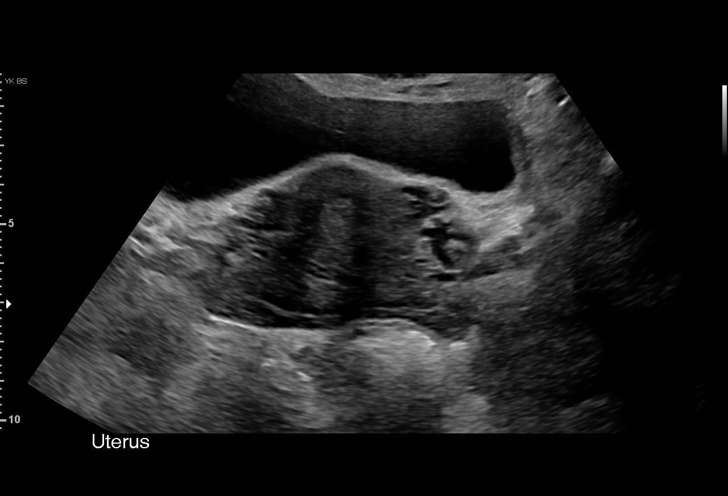
[im 17/58]
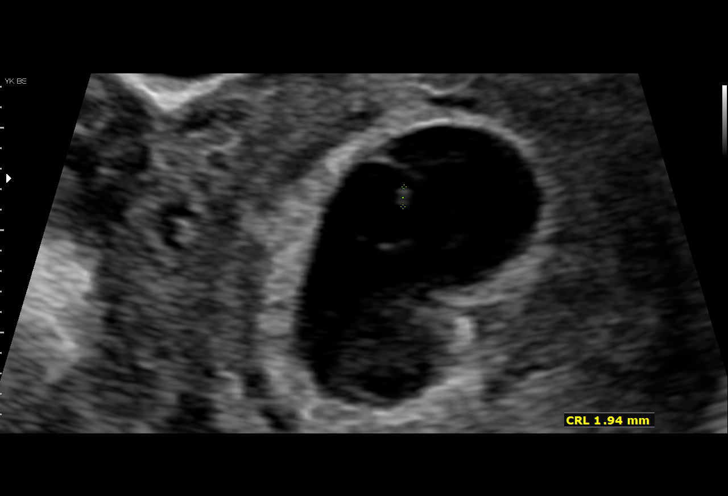
[im 22/58]
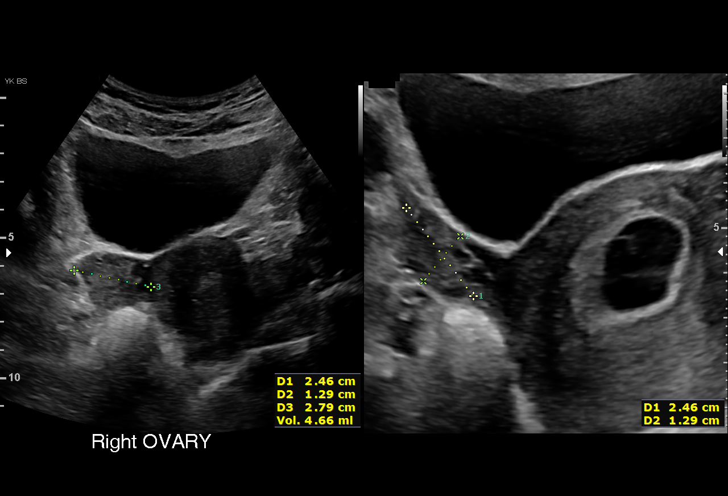
[im 26/58]
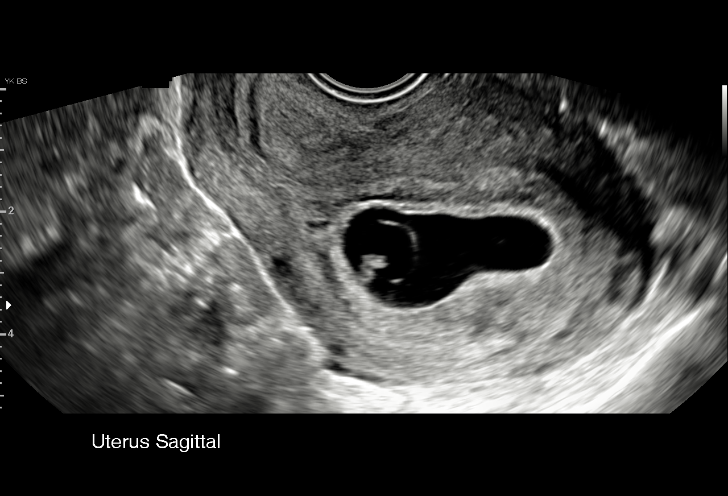
[im 30/58]
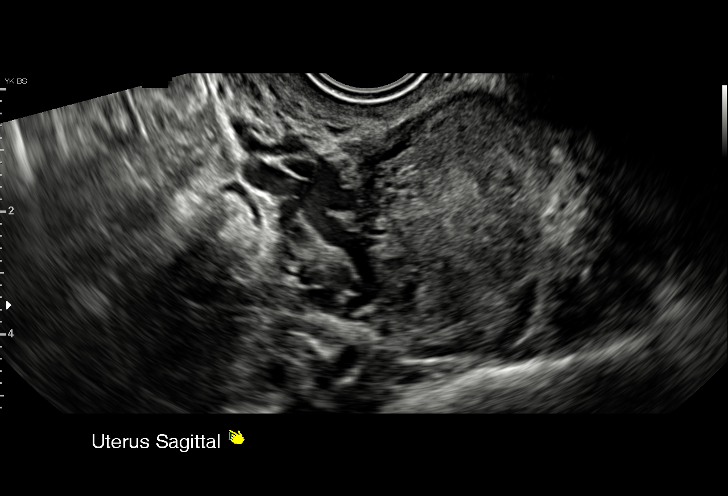
[im 32/58]
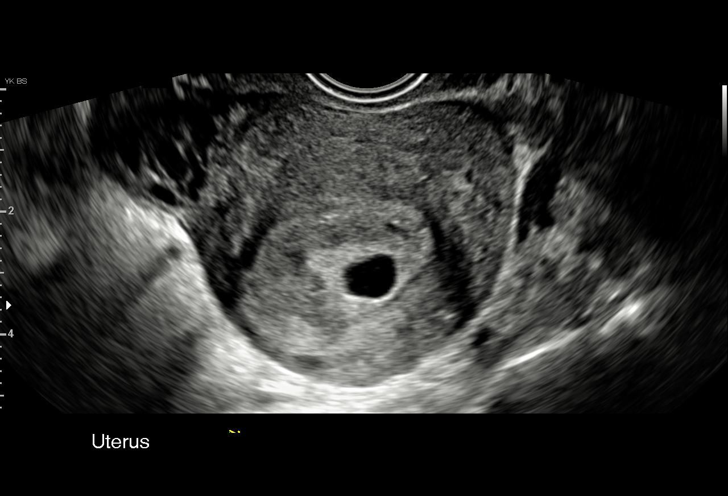
[im 36/58]
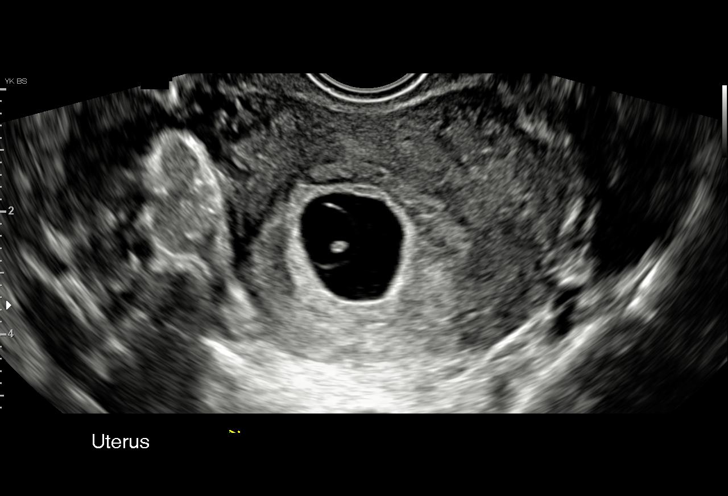
[im 41/58]
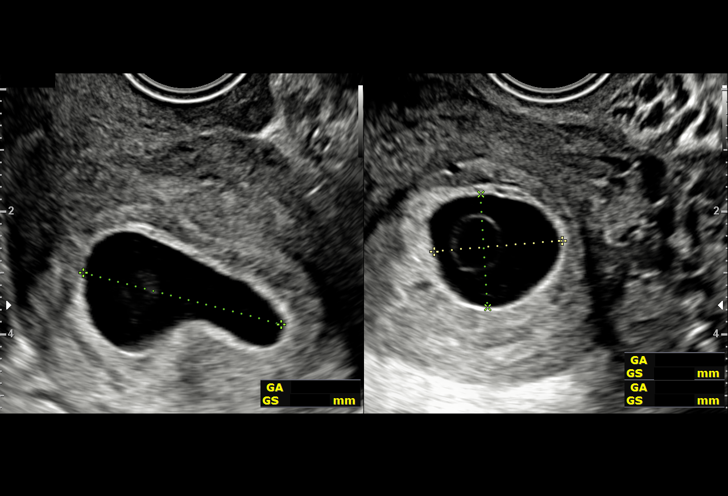
[im 45/58]
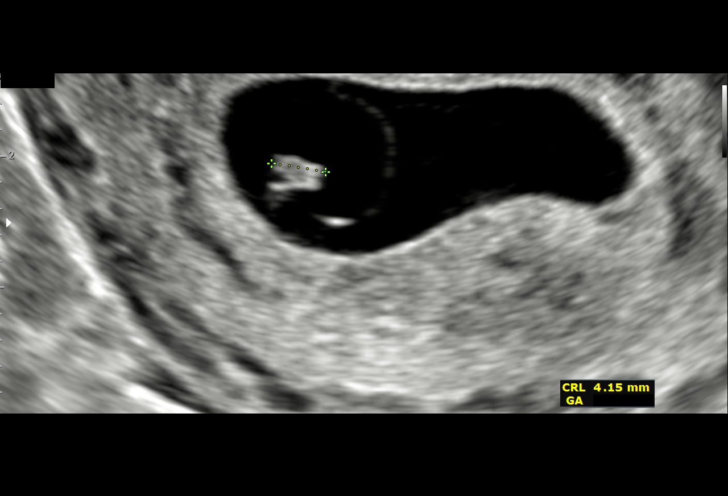
[im 49/58]
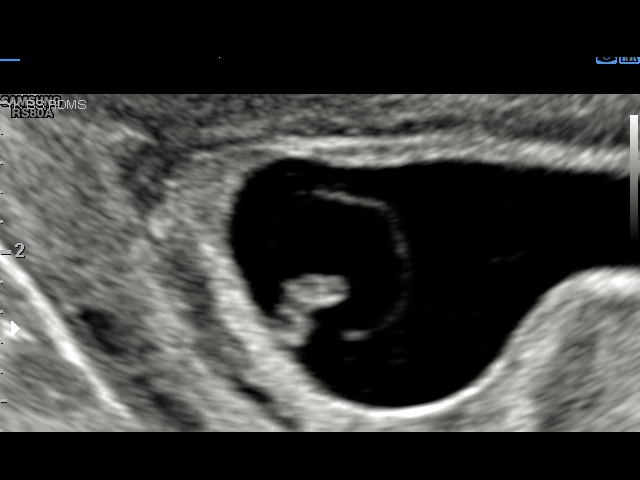
[im 53/58]
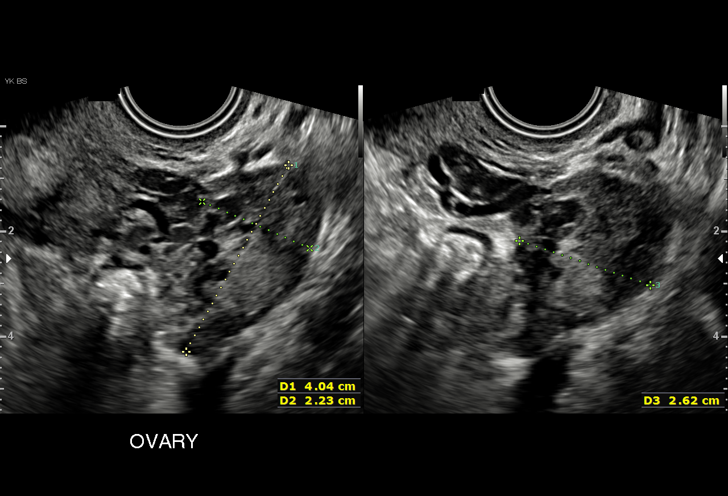
[im 58/58]
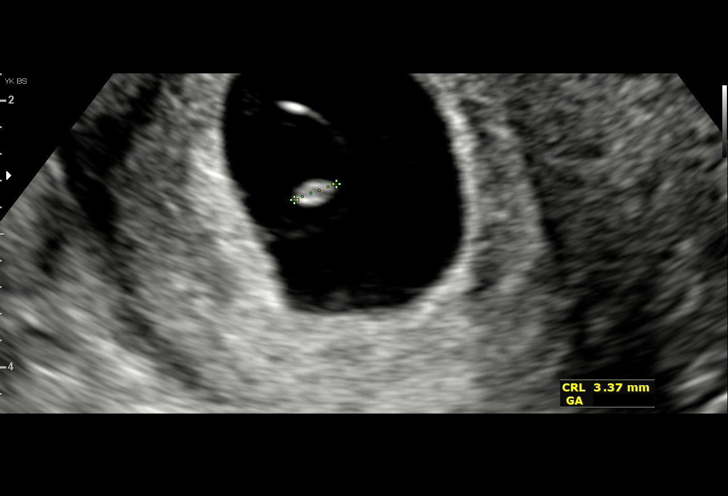

[15 of 28 positions shown; findings below may reference images not displayed]

FINDINGS: Intrauterine gestational sac: Visualized/normal in shape.

Yolk sac:  Visualized.

Embryo:  Visualized.

Cardiac Activity: Not visualized

CRL:  4.0 mm; 6 w 0 d;

Subchorionic hemorrhage: None.

Maternal uterus/adnexae: Both maternal ovaries are visualized. Trace
free pelvic fluid. No adnexal mass.
IMPRESSION: 4 mm embryo without visible cardiac activity. Findings are
suspicious but not yet definitive for failed pregnancy. Recommend
follow-up US in 10-14 days for definitive diagnosis. This
recommendation follows SRU consensus guidelines: Diagnostic Criteria
for Nonviable Pregnancy Early in the First Trimester. N Engl J Med

## 2022-04-29 ENCOUNTER — Other Ambulatory Visit: Payer: Self-pay

## 2022-04-29 ENCOUNTER — Encounter (HOSPITAL_BASED_OUTPATIENT_CLINIC_OR_DEPARTMENT_OTHER): Payer: Self-pay

## 2022-04-29 ENCOUNTER — Emergency Department (HOSPITAL_BASED_OUTPATIENT_CLINIC_OR_DEPARTMENT_OTHER)
Admission: EM | Admit: 2022-04-29 | Discharge: 2022-04-29 | Discharge status: Against Medical Advice | Payer: Medicaid Other | Attending: Emergency Medicine | Admitting: Emergency Medicine

## 2022-04-29 DIAGNOSIS — R102 Pelvic and perineal pain: Secondary | ICD-10-CM | POA: Insufficient documentation

## 2022-04-29 DIAGNOSIS — Z5321 Procedure and treatment not carried out due to patient leaving prior to being seen by health care provider: Secondary | ICD-10-CM | POA: Insufficient documentation

## 2022-04-29 DIAGNOSIS — R350 Frequency of micturition: Secondary | ICD-10-CM | POA: Diagnosis not present

## 2022-04-29 DIAGNOSIS — N898 Other specified noninflammatory disorders of vagina: Secondary | ICD-10-CM | POA: Diagnosis not present

## 2022-04-29 DIAGNOSIS — M545 Low back pain, unspecified: Secondary | ICD-10-CM | POA: Diagnosis not present

## 2022-04-29 LAB — URINALYSIS, ROUTINE W REFLEX MICROSCOPIC
Bilirubin Urine: NEGATIVE
Glucose, UA: NEGATIVE mg/dL
Hgb urine dipstick: NEGATIVE
Ketones, ur: NEGATIVE mg/dL
Leukocytes,Ua: NEGATIVE
Nitrite: NEGATIVE
Protein, ur: NEGATIVE mg/dL
Specific Gravity, Urine: 1.02 (ref 1.005–1.030)
pH: 7.5 (ref 5.0–8.0)

## 2022-04-29 LAB — PREGNANCY, URINE: Preg Test, Ur: NEGATIVE

## 2022-04-29 NOTE — ED Notes (Signed)
UA and UC sent to lab 

## 2022-04-29 NOTE — ED Triage Notes (Signed)
In for eval of pelvic pain, painful intercourse, white clumpy vaginal discharge. Increased urinary frequency. LMP 04/19/2022. Lower back aching.

## 2022-07-19 ENCOUNTER — Encounter (HOSPITAL_BASED_OUTPATIENT_CLINIC_OR_DEPARTMENT_OTHER): Payer: Self-pay

## 2022-07-19 ENCOUNTER — Emergency Department (HOSPITAL_BASED_OUTPATIENT_CLINIC_OR_DEPARTMENT_OTHER)
Admission: EM | Admit: 2022-07-19 | Discharge: 2022-07-19 | Disposition: A | Discharge status: Home / Self Care | Payer: Medicaid Other | Attending: Emergency Medicine | Admitting: Emergency Medicine

## 2022-07-19 ENCOUNTER — Other Ambulatory Visit: Payer: Self-pay

## 2022-07-19 DIAGNOSIS — B379 Candidiasis, unspecified: Secondary | ICD-10-CM

## 2022-07-19 DIAGNOSIS — B3731 Acute candidiasis of vulva and vagina: Secondary | ICD-10-CM | POA: Diagnosis not present

## 2022-07-19 DIAGNOSIS — N898 Other specified noninflammatory disorders of vagina: Secondary | ICD-10-CM | POA: Diagnosis present

## 2022-07-19 LAB — WET PREP, GENITAL
Clue Cells Wet Prep HPF POC: NONE SEEN
Sperm: NONE SEEN
Trich, Wet Prep: NONE SEEN
WBC, Wet Prep HPF POC: >=10 — AB (ref <10)

## 2022-07-19 MED ORDER — CLOTRIMAZOLE 1 % VA CREA: 1.0000 | TOPICAL_CREAM | Freq: Every day | VAGINAL | 0 refills | Status: AC

## 2022-07-19 NOTE — Discharge Instructions (Addendum)
Today your wet mount showed a yeast infection which would explain your vaginal discharge and odor.  Please pick up the vaginal cream I have prescribed for you and follow-up with your OB/GYN.  If you do not want to follow-up with a OB/GYN I have attached the women's clinic for you to follow-up with regarding your recent pregnancy status and yeast infection.  An ultrasound was not conducted today as her symptoms are most likely caused from the yeast infection as opposed to a miscarriage or ectopic pregnancy.  Your cervical os was closed today which is reassuring as you are not endorsing any vaginal bleeding.

## 2022-07-19 NOTE — ED Triage Notes (Signed)
Patient stated she is concerned about having BV. She stated she is having discharge, foul smell and itching for 10 days.

## 2022-07-19 NOTE — ED Provider Notes (Signed)
[place] EMERGENCY DEPARTMENT AT [facility] Provider Note   CSN: [id number] Arrival date & time: 07/19/22  7829     History  Chief Complaint  Patient presents with   Vaginal Discharge    Traci Carney is a 27 y.o. female [id number] history of IUGR, miscarriages presented with 1 week of vaginal discharge with fishy odor.  Patient states she has a history of BV and is adamant that she has BV today.  Patient is been seen by her OB/GYN the past 3 days and had negative results is upset that she did not get antibiotics as she is concerned she has BV.  Patient also notes that she tested positive for pregnancy 3 days ago.  Patient came in today to have a pelvic exam to be tested again for BV.  Patient denied chest pain, shortness of breath, abdominal pain, nausea/vomiting, vaginal bleeding, flank pain, change in sensation/motor skills, fatigue  Home Medications Prior to Admission medications   Medication Sig Start Date End Date Taking? Authorizing Provider  clotrimazole (GYNE-LOTRIMIN) 1 % vaginal cream Place 1 Applicatorful vaginally at bedtime for 7 days. 07/19/22 07/26/22 Yes Zoeie Ritter, Beverly Gust, PA-C  acetaminophen-codeine (TYLENOL #3) 300-30 MG tablet Take 1-2 tablets by mouth every 6 (six) hours as needed for moderate pain. 07/11/19   Marylene Land, CNM  amLODipine (NORVASC) 10 MG tablet Take 1 tablet (10 mg total) by mouth daily. Patient not taking: Reported on 06/11/2018 05/16/18   Tilda Burrow, MD  doxycycline (VIBRAMYCIN) 100 MG capsule Take 1 capsule (100 mg total) by mouth 2 (two) times daily. 01/11/22   Rancour, Jeannett Senior, MD  hydrOXYzine (ATARAX/VISTARIL) 25 MG tablet Take 1-2 tablets (25-50 mg total) by mouth every 6 (six) hours as needed for anxiety. Patient not taking: Reported on 01/11/2019 08/03/18   Gilda Crease, MD  ibuprofen (ADVIL,MOTRIN) 600 MG tablet Take 1 tablet (600 mg total) by mouth every 6 (six) hours. Patient not taking: Reported on  06/11/2018 05/16/18   Tilda Burrow, MD  lidocaine (XYLOCAINE) 2 % solution Use as directed 15 mLs in the mouth or throat as needed (throat pain). 11/09/19   Gailen Shelter, PA  metroNIDAZOLE (FLAGYL) 500 MG tablet Take 1 tablet (500 mg total) by mouth 2 (two) times daily. 03/14/22   Theron Arista, PA-C  misoprostol (CYTOTEC) 200 MCG tablet Take 4 tablets (800 mcg total) by mouth once for 1 dose. Place two tablets on each cheek next to gumlines. Repeat in 48 hours if no bleeding. 07/11/19 07/11/19  Marylene Land, CNM  Prenatal Vit-Fe Fumarate-FA (PRENATAL COMPLETE) 14-0.4 MG TABS Take 1 tablet by mouth daily after breakfast. Patient not taking: Reported on 06/11/2018 11/27/17   Judeth Horn, NP  promethazine (PHENERGAN) 12.5 MG tablet Take 1 tablet (12.5 mg total) by mouth every 6 (six) hours as needed for nausea or vomiting. 07/11/19   Marylene Land, CNM      Allergies    Patient has no known allergies.    Review of Systems   Review of Systems  Genitourinary:  Positive for vaginal discharge.    Physical Exam Updated Vital Signs BP 125/75   Pulse 79   Temp (!) 97.5 F (36.4 C) (Oral)   Resp 18   Ht  (1.676 m)   Wt 83 kg   LMP 06/16/2022   SpO2 100%   BMI 29.54 kg/m  Physical Exam Exam conducted with a chaperone present.  Constitutional:  General: She is not in acute distress. Abdominal:     Palpations: Abdomen is soft.     Tenderness: There is no abdominal tenderness. There is no right CVA tenderness, left CVA tenderness, guarding or rebound.  Genitourinary:    Exam position: Lithotomy position.     Labia:        Right: No rash, tenderness, lesion or injury.        Left: No rash, tenderness, lesion or injury.      Vagina: Normal.     Uterus: Normal.      Adnexa: Right adnexa normal and left adnexa normal.     Comments: Chaperone: Sandria Bales, RN No CMT No discharge noted No vaginal bleeding noted Cervical os closed Skin:     General: Skin is warm and dry.  Neurological:     Mental Status: She is alert.  Psychiatric:        Mood and Affect: Mood normal.     ED Results / Procedures / Treatments   Labs (all labs ordered are listed, but only abnormal results are displayed) Labs Reviewed  WET PREP, GENITAL - Abnormal; Notable for the following components:      Result Value   Yeast Wet Prep HPF POC PRESENT (*)    WBC, Wet Prep HPF POC >=10 (*)    All other components within normal limits    EKG None  Radiology No results found.  Procedures Procedures    Medications Ordered in ED Medications - No data to display  ED Course/ Medical Decision Making/ A&P                             Medical Decision Making Amount and/or Complexity of Data Reviewed Labs: ordered.   Jeneane Pieczynski Hagerty 27 y.o. presented today for vaginal discharge. Working DDx that I considered at this time includes, but not limited to, STI, BV/trichomonas, threatened/spontaneous/inevitable miscarriage, ectopic pregnancy.  R/o DDx: STI, BV/trichomonas, spontaneous/inevitable miscarriage, ectopic pregnancy: These are considered less likely due to history of present illness and physical exam findings  Review of prior external notes: 07/16/2022 office visit  Unique Tests and My Interpretation:  Wet mount: Yeast cells present  Discussion with Independent Historian: None  Discussion of Management of Tests: None  Risk: Medium: prescription drug management  Risk Stratification Score: none  Staffed with Trifan, MD  Plan: Patient presented for vaginal discharge. On exam patient was no acute distress and stable vitals.  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 Patient is adamant that she has BV due to having a fishy odor and wants a pelvic exam.  With a chaperone in the room about exam was conducted and did not show any vaginal bleeding, discharge, CMT. cervical os was closed suggesting that she does not have  a threatened miscarriage or ectopic and will need to follow-up with her OB/GYN in regards of next steps.  Pending wet mount patient will be discharged on antibiotics.  I spoke about the importance of following up with the patient's OB/GYN regarding recent symptoms and findings.  Patient stable at this time.  Patient blood all came back positive for yeast cells.  Patient most likely has a yeast infection which would explain her symptoms.  Due to patient's pregnancy status patient will be given clotrimazole vaginal cream and encouraged to follow-up with her OB/GYN for next steps.  I spoke to the patient at length as to why we  will not to ultrasound today as she is not having any vaginal bleeding or severe abdominal pain that would indicate ectopic or miscarriage though need to be further evaluated.  I also spoke to her about how her symptoms can be explained with yeast infection and said that she can take Tylenol every 6 hours needed for pain and strongly encouraged her to not take ibuprofen due to her pregnancy status.  Patient was given return precautions. Patient stable for discharge at this time.  Patient verbalized understanding of plan.         Final Clinical Impression(s) / ED Diagnoses Final diagnoses:  Yeast infection    Rx / DC Orders ED Discharge Orders          Ordered    clotrimazole (GYNE-LOTRIMIN) 1 % vaginal cream  Daily at bedtime        07/19/22 0949              Netta Corrigan, PA-C 07/19/22 1024    Terald Sleeper, MD 07/19/22 [phone]

## 2022-07-25 ENCOUNTER — Inpatient Hospital Stay (HOSPITAL_COMMUNITY)
Admission: AD | Admit: 2022-07-25 | Discharge: 2022-07-25 | Disposition: A | Discharge status: Home / Self Care | Payer: Medicaid Other | Attending: Family Medicine | Admitting: Family Medicine

## 2022-07-25 ENCOUNTER — Inpatient Hospital Stay (HOSPITAL_COMMUNITY): Payer: Medicaid Other

## 2022-07-25 ENCOUNTER — Encounter (HOSPITAL_COMMUNITY): Payer: Self-pay | Admitting: *Deleted

## 2022-07-25 DIAGNOSIS — Z1152 Encounter for screening for COVID-19: Secondary | ICD-10-CM | POA: Insufficient documentation

## 2022-07-25 DIAGNOSIS — O26899 Other specified pregnancy related conditions, unspecified trimester: Secondary | ICD-10-CM

## 2022-07-25 DIAGNOSIS — O26891 Other specified pregnancy related conditions, first trimester: Secondary | ICD-10-CM

## 2022-07-25 DIAGNOSIS — J40 Bronchitis, not specified as acute or chronic: Secondary | ICD-10-CM | POA: Diagnosis not present

## 2022-07-25 DIAGNOSIS — R058 Other specified cough: Secondary | ICD-10-CM | POA: Diagnosis not present

## 2022-07-25 DIAGNOSIS — O3680X0 Pregnancy with inconclusive fetal viability, not applicable or unspecified: Secondary | ICD-10-CM

## 2022-07-25 DIAGNOSIS — R102 Pelvic and perineal pain: Secondary | ICD-10-CM

## 2022-07-25 DIAGNOSIS — Z3A01 Less than 8 weeks gestation of pregnancy: Secondary | ICD-10-CM

## 2022-07-25 LAB — URINALYSIS, ROUTINE W REFLEX MICROSCOPIC
Bilirubin Urine: NEGATIVE
Glucose, UA: NEGATIVE mg/dL
Hgb urine dipstick: NEGATIVE
Ketones, ur: NEGATIVE mg/dL
Nitrite: NEGATIVE
Protein, ur: NEGATIVE mg/dL
Specific Gravity, Urine: 1.005 (ref 1.005–1.030)
pH: 6 (ref 5.0–8.0)

## 2022-07-25 LAB — CBC
HCT: 36.3 % (ref 36.0–46.0)
Hemoglobin: 12.1 g/dL (ref 12.0–15.0)
MCH: 27.1 pg (ref 26.0–34.0)
MCHC: 33.3 g/dL (ref 30.0–36.0)
MCV: 81.4 fL (ref 80.0–100.0)
Platelets: 244 10*3/uL (ref 150–400)
RBC: 4.46 MIL/uL (ref 3.87–5.11)
RDW: 13.9 % (ref 11.5–15.5)
WBC: 8.1 10*3/uL (ref 4.0–10.5)
nRBC: 0 % (ref 0.0–0.2)

## 2022-07-25 LAB — SARS CORONAVIRUS 2 BY RT PCR: SARS Coronavirus 2 by RT PCR: NEGATIVE

## 2022-07-25 LAB — HCG, QUANTITATIVE, PREGNANCY: hCG, Beta Chain, Quant, S: 4495 m[IU]/mL — ABNORMAL HIGH (ref <5)

## 2022-07-25 MED ORDER — GUAIFENESIN ER 600 MG PO TB12: 600.0000 mg | ORAL_TABLET | Freq: Two times a day (BID) | ORAL | 0 refills | Status: AC | PRN

## 2022-07-25 MED ORDER — HYDROCOD POLI-CHLORPHE POLI ER 10-8 MG/5ML PO SUER: 5.0000 mL | Freq: Two times a day (BID) | ORAL | 0 refills | Status: AC | PRN

## 2022-07-25 MED ORDER — GUAIFENESIN ER 600 MG PO TB12
600.0000 mg | ORAL_TABLET | Freq: Once | ORAL | Status: AC
  Administered 2022-07-25: 600 mg via ORAL
  Filled 2022-07-25: qty 1

## 2022-07-25 NOTE — MAU Provider Note (Signed)
Chief Complaint: Cough   Event Date/Time   First Provider Initiated Contact with Patient 07/25/22 2104        SUBJECTIVE HPI: Traci Carney is a 27 y.o. G5P1031 at [redacted]w[redacted]d by LMP who presents to maternity admissions reporting pelvic cramping and productive cough.  Was evaluated in ED for vaginal discharge and treated for yeast.  Concerned about miscarriage. . She denies vaginal bleeding, vaginal itching/burning, urinary symptoms, h/a, dizziness, n/v, or fever/chills.     Cough This is a new problem. The current episode started in the past 7 days. The problem has been unchanged. The cough is Productive of sputum. Associated symptoms include a sore throat. Pertinent negatives include no fever, hemoptysis, nasal congestion or shortness of breath. She has tried nothing for the symptoms. Her past medical history is significant for environmental allergies. There is no history of asthma.   RN Note: Traci Carney is a 27 y.o. at [redacted]w[redacted]d here in MAU reporting: cough x 3-4 days. Scratchy throat, no fever. Pt stated she gets bronchitis once a year. She was pregnant in 2021 and had Bronchitis and miscarried was afraid it was due to that. Wanted to make sure everything was ok with pregnancy because she is having cramping in her pelvis and back x 2 days.  Was told she had yeast and took 3 day yeast meds and thinks it cleared up. LMP: 06/16/22 Onset of complaint: 3-4 days       Pain score: 4  History reviewed. No pertinent past medical history. Past Surgical History:  Procedure Laterality Date   CHOLECYSTECTOMY     DILATION AND CURETTAGE OF UTERUS  08/2018   EAB   HERNIA REPAIR     TONSILLECTOMY     Social History   Socioeconomic History   Marital status: Single    Spouse name: Not on file   Number of children: Not on file   Years of education: Not on file   Highest education level: Not on file  Occupational History   Not on file  Tobacco Use   Smoking status: Former   Smokeless tobacco: Never   Vaping Use   Vaping Use: Never used  Substance and Sexual Activity   Alcohol use: Yes    Comment: occasionally    Drug use: No   Sexual activity: Not Currently    Birth control/protection: None    Comment: IC Tuesday 4/23  Other Topics Concern   Not on file  Social History Narrative   Not on file   Social Determinants of Health   Financial Resource Strain: Not on file  Food Insecurity: No Food Insecurity (04/07/2018)   Hunger Vital Sign    Worried About Running Out of Food in the Last Year: Never true    Ran Out of Food in the Last Year: Never true  Transportation Needs: No Transportation Needs (04/07/2018)   PRAPARE - Administrator, Civil Service (Medical): No    Lack of Transportation (Non-Medical): No  Physical Activity: Not on file  Stress: Not on file  Social Connections: Not on file  Intimate Partner Violence: Not on file   No current facility-administered medications on file prior to encounter.   Current Outpatient Medications on File Prior to Encounter  Medication Sig Dispense Refill   Prenatal Vit-Fe Fumarate-FA (PRENATAL COMPLETE) 14-0.4 MG TABS Take 1 tablet by mouth daily after breakfast. 30 each 2   acetaminophen-codeine (TYLENOL #3) 300-30 MG tablet Take 1-2 tablets by mouth every 6 (six)  hours as needed for moderate pain. 15 tablet 0   amLODipine (NORVASC) 10 MG tablet Take 1 tablet (10 mg total) by mouth daily. (Patient not taking: Reported on 06/11/2018) 30 tablet 1   clotrimazole (GYNE-LOTRIMIN) 1 % vaginal cream Place 1 Applicatorful vaginally at bedtime for 7 days. 45 g 0   doxycycline (VIBRAMYCIN) 100 MG capsule Take 1 capsule (100 mg total) by mouth 2 (two) times daily. 20 capsule 0   hydrOXYzine (ATARAX/VISTARIL) 25 MG tablet Take 1-2 tablets (25-50 mg total) by mouth every 6 (six) hours as needed for anxiety. (Patient not taking: Reported on 01/11/2019) 12 tablet 0   ibuprofen (ADVIL,MOTRIN) 600 MG tablet Take 1 tablet (600 mg total) by mouth  every 6 (six) hours. (Patient not taking: Reported on 06/11/2018) 30 tablet 0   lidocaine (XYLOCAINE) 2 % solution Use as directed 15 mLs in the mouth or throat as needed (throat pain). 100 mL 0   metroNIDAZOLE (FLAGYL) 500 MG tablet Take 1 tablet (500 mg total) by mouth 2 (two) times daily. 14 tablet 0   misoprostol (CYTOTEC) 200 MCG tablet Take 4 tablets (800 mcg total) by mouth once for 1 dose. Place two tablets on each cheek next to gumlines. Repeat in 48 hours if no bleeding. 4 tablet 1   promethazine (PHENERGAN) 12.5 MG tablet Take 1 tablet (12.5 mg total) by mouth every 6 (six) hours as needed for nausea or vomiting. 30 tablet 0   No Known Allergies  I have reviewed patient's Past Medical Hx, Surgical Hx, Family Hx, Social Hx, medications and allergies.   ROS:  Review of Systems  Constitutional:  Negative for fever.  HENT:  Positive for sore throat.   Respiratory:  Positive for cough. Negative for hemoptysis and shortness of breath.   Allergic/Immunologic: Positive for environmental allergies.   Review of Systems  Other systems negative   Physical Exam  Physical Exam Patient Vitals for the past 24 hrs:  BP Temp Pulse Resp SpO2 Height Weight  07/25/22 2051 129/88 -- 73 -- -- -- --  07/25/22 2050 -- -- -- -- 100 % -- --  07/25/22 2045 -- -- -- -- 100 % -- --  07/25/22 2025 94/77 98.1 F (36.7 C) 89 18 100 % 5\' 6"  (1.676 m) 83.9 kg   Constitutional: Well-developed, well-nourished female in no acute distress.  Cardiovascular: normal rate Respiratory: normal effort  Lungs Clear to auscultation bilaterally, no wheezes or rales GI: Abd soft, non-tender.  MS: Extremities nontender, no edema, normal ROM Neurologic: Alert and oriented x 4.  GU: Neg CVAT.  PELVIC EXAM: deferred  LAB RESULTS Results for orders placed or performed during the hospital encounter of 07/25/22 (from the past 24 hour(s))  Urinalysis, Routine w reflex microscopic -Urine, Clean Catch     Status: Abnormal    Collection Time: 07/25/22  8:27 PM  Result Value Ref Range   Color, Urine YELLOW YELLOW   APPearance CLOUDY (A) CLEAR   Specific Gravity, Urine 1.005 1.005 - 1.030   pH 6.0 5.0 - 8.0   Glucose, UA NEGATIVE NEGATIVE mg/dL   Hgb urine dipstick NEGATIVE NEGATIVE   Bilirubin Urine NEGATIVE NEGATIVE   Ketones, ur NEGATIVE NEGATIVE mg/dL   Protein, ur NEGATIVE NEGATIVE mg/dL   Nitrite NEGATIVE NEGATIVE   Leukocytes,Ua MODERATE (A) NEGATIVE   RBC / HPF 0-5 0 - 5 RBC/hpf   WBC, UA 6-10 0 - 5 WBC/hpf   Bacteria, UA FEW (A) NONE SEEN   Squamous Epithelial /  HPF 11-20 0 - 5 /HPF   Mucus PRESENT   SARS Coronavirus 2 by RT PCR (hospital order, performed in [facility] hospital lab) *cepheid single result test* Anterior Nasal Swab     Status: None   Collection Time: 07/25/22  9:15 PM   Specimen: Anterior Nasal Swab  Result Value Ref Range   SARS Coronavirus 2 by RT PCR NEGATIVE NEGATIVE  CBC     Status: None   Collection Time: 07/25/22  9:30 PM  Result Value Ref Range   WBC 8.1 4.0 - 10.5 K/uL   RBC 4.46 3.87 - 5.11 MIL/uL   Hemoglobin 12.1 12.0 - 15.0 g/dL   HCT 40.9 81.1 - 91.4 %   MCV 81.4 80.0 - 100.0 fL   MCH 27.1 26.0 - 34.0 pg   MCHC 33.3 30.0 - 36.0 g/dL   RDW 78.2 95.6 - 21.3 %   Platelets 244 150 - 400 K/uL   nRBC 0.0 0.0 - 0.2 %  hCG, quantitative, pregnancy     Status: Abnormal   Collection Time: 07/25/22  9:30 PM  Result Value Ref Range   hCG, Beta Chain, Quant, S 4,495 (H) <5 mIU/mL        IMAGING US OB LESS THAN 14 WEEKS WITH OB TRANSVAGINAL  Result Date: 07/25/2022 CLINICAL DATA:  Pelvic pain. EXAM: OBSTETRIC <14 WK [place] AND TRANSVAGINAL OB US TECHNIQUE: Both transabdominal and transvaginal ultrasound examinations were performed for complete evaluation of the gestation as well as the maternal uterus, adnexal regions, and pelvic cul-de-sac. Transvaginal technique was performed to assess early pregnancy. COMPARISON:  None Available. FINDINGS: Intrauterine gestational  sac: Single Yolk sac:  Visualized. Embryo:  Visualized. Cardiac Activity: Visualized. Heart Rate: 111 bpm CRL:  2.4 mm   5 w   5 d                  [place] EDC: March 22, 2023 Subchorionic hemorrhage:  None visualized. Maternal uterus/adnexae: The bilateral ovaries are visualized and are normal in appearance. A small amount of pelvic free fluid is seen. IMPRESSION: Single, viable intrauterine pregnancy at approximately 5 weeks and 5 days gestation by ultrasound evaluation. Electronically Signed   By: Aram Candela M.D.   On: 07/25/2022 22:07     MAU Management/MDM: I have reviewed the triage vital signs and the nursing notes.   Pertinent labs & imaging results that were available during my care of the patient were reviewed by me and considered in my medical decision making (see chart for details).      I have reviewed her medical records including past results, notes and treatments. Medical, Surgical, and family history were reviewed.  Medications and recent lab tests were reviewed  Ordered usual first trimester r/o ectopic labs.   Will check baseline Ultrasound to rule out ectopic.  This bleeding/pain can represent a normal pregnancy with bleeding, spontaneous abortion or even an ectopic which can be life-threatening.  The process as listed above helps to determine which of these is present.  Treatments in MAU included Mucinex  Reviewed findings of live IUP DIscussed mucinex for cough, will add Tussionex for cough suppression at night.  Recommend daily antihistamine.   ASSESSMENT Single IUP at [redacted]w[redacted]d Pregnancy of unknown location  Productive cough, likely viral bronchitis   PLAN Discharge home Rx Mucinex for cough Rx Tussionex for cough prn Start prenatal care  Pt stable at time of discharge. Encouraged to return here if she develops worsening of symptoms, increase in pain,  fever, or other concerning symptoms.    Wynelle Bourgeois CNM, MSN Certified Nurse-Midwife 07/25/2022  9:04  PM

## 2022-11-03 ENCOUNTER — Emergency Department (HOSPITAL_BASED_OUTPATIENT_CLINIC_OR_DEPARTMENT_OTHER)
Admission: EM | Admit: 2022-11-03 | Discharge: 2022-11-03 | Disposition: A | Discharge status: Home / Self Care | Payer: Medicaid Other | Attending: Emergency Medicine | Admitting: Emergency Medicine

## 2022-11-03 ENCOUNTER — Other Ambulatory Visit: Payer: Self-pay

## 2022-11-03 ENCOUNTER — Emergency Department (HOSPITAL_BASED_OUTPATIENT_CLINIC_OR_DEPARTMENT_OTHER): Payer: Medicaid Other

## 2022-11-03 ENCOUNTER — Encounter (HOSPITAL_BASED_OUTPATIENT_CLINIC_OR_DEPARTMENT_OTHER): Payer: Self-pay

## 2022-11-03 DIAGNOSIS — W1840XA Slipping, tripping and stumbling without falling, unspecified, initial encounter: Secondary | ICD-10-CM | POA: Diagnosis not present

## 2022-11-03 DIAGNOSIS — O10912 Unspecified pre-existing hypertension complicating pregnancy, second trimester: Secondary | ICD-10-CM | POA: Insufficient documentation

## 2022-11-03 DIAGNOSIS — S92422A Displaced fracture of distal phalanx of left great toe, initial encounter for closed fracture: Secondary | ICD-10-CM | POA: Insufficient documentation

## 2022-11-03 DIAGNOSIS — O9A212 Injury, poisoning and certain other consequences of external causes complicating pregnancy, second trimester: Secondary | ICD-10-CM | POA: Insufficient documentation

## 2022-11-03 DIAGNOSIS — Z3A20 20 weeks gestation of pregnancy: Secondary | ICD-10-CM | POA: Insufficient documentation

## 2022-11-03 DIAGNOSIS — O26892 Other specified pregnancy related conditions, second trimester: Secondary | ICD-10-CM | POA: Diagnosis present

## 2022-11-03 LAB — URINALYSIS, ROUTINE W REFLEX MICROSCOPIC
Bilirubin Urine: NEGATIVE
Glucose, UA: NEGATIVE mg/dL
Hgb urine dipstick: NEGATIVE
Ketones, ur: NEGATIVE mg/dL
Leukocytes,Ua: NEGATIVE
Nitrite: NEGATIVE
Protein, ur: NEGATIVE mg/dL
Specific Gravity, Urine: 1.025 (ref 1.005–1.030)
pH: 7.5 (ref 5.0–8.0)

## 2022-11-03 NOTE — ED Provider Notes (Signed)
[place] [facility] Provider Note   CSN: [id number] Arrival date & time: 11/03/22  1203     History  Chief Complaint  Patient presents with   Toe Pain    Traci Carney is a 27 y.o. female she is [redacted] weeks pregnant.  She presents to the emergency department with left foot injury.  Patient was at a wedding last night wearing heels.  She complains of pain in her left toe.  She works on her feet all day cooking food.  Patient also reports that her ankles have been swollen at baseline.  She does have a history of previous hypertension in pregnancy but denies preeclampsia.  Review of EMR shows recent GYN visit urine dipstick positive for trace proteins.  She denies any other concerns including headaches or numbness.   Toe Pain       Home Medications Prior to Admission medications   Medication Sig Start Date End Date Taking? Authorizing Provider  chlorpheniramine-HYDROcodone (TUSSIONEX) 10-8 MG/5ML Take 5 mLs by mouth every 12 (twelve) hours as needed for cough. 07/25/22   Aviva Signs, CNM  guaiFENesin (MUCINEX) 600 MG 12 hr tablet Take 1 tablet (600 mg total) by mouth 2 (two) times daily as needed. 07/25/22   Aviva Signs, CNM  Prenatal Vit-Fe Fumarate-FA (PRENATAL COMPLETE) 14-0.4 MG TABS Take 1 tablet by mouth daily after breakfast. 11/27/17   Judeth Horn, NP  promethazine (PHENERGAN) 12.5 MG tablet Take 1 tablet (12.5 mg total) by mouth every 6 (six) hours as needed for nausea or vomiting. 07/11/19   Marylene Land, CNM      Allergies    Patient has no known allergies.    Review of Systems   Review of Systems  Physical Exam Updated Vital Signs BP 120/81   Pulse 82   Temp 98.4 F (36.9 C) (Oral)   Resp 18   Ht 5\' 6"  (1.676 m)   Wt 88 kg   LMP 06/16/2022   SpO2 96%   BMI 31.31 kg/m  Physical Exam Vitals and nursing note reviewed.  Constitutional:      General: She is not in acute distress.    Appearance:  She is well-developed. She is not diaphoretic.  HENT:     Head: Normocephalic and atraumatic.     Right Ear: External ear normal.     Left Ear: External ear normal.     Nose: Nose normal.     Mouth/Throat:     Mouth: Mucous membranes are moist.  Eyes:     General: No scleral icterus.    Conjunctiva/sclera: Conjunctivae normal.  Cardiovascular:     Rate and Rhythm: Normal rate and regular rhythm.     Heart sounds: Normal heart sounds. No murmur heard.    No friction rub. No gallop.  Pulmonary:     Effort: Pulmonary effort is normal. No respiratory distress.     Breath sounds: Normal breath sounds.  Abdominal:     General: Bowel sounds are normal. There is no distension.     Palpations: Abdomen is soft. There is no mass.     Tenderness: There is no abdominal tenderness. There is no guarding.  Musculoskeletal:     Cervical back: Normal range of motion.     Comments: Left toe examined shows global swelling, mild erythema, tenderness along the shaft of the toe.  No evidence of infection.  Skin:    General: Skin is warm and dry.  Neurological:  Mental Status: She is alert and oriented to person, place, and time.  Psychiatric:        Behavior: Behavior normal.     ED Results / Procedures / Treatments   Labs (all labs ordered are listed, but only abnormal results are displayed) Labs Reviewed  URINALYSIS, ROUTINE W REFLEX MICROSCOPIC    EKG None  Radiology DG Foot Complete Left  Result Date: 11/03/2022 CLINICAL DATA:  Patient's lip with great toe injury. EXAM: LEFT FOOT - COMPLETE 3+ VIEW COMPARISON:  No comparison studies available. FINDINGS: No gross fracture or dislocation. Tiny cortical avulsion injury identified lateral base of the great toe distal phalanx. No worrisome lytic or sclerotic osseous abnormality. IMPRESSION: Tiny cortical avulsion injury at the lateral base of the great toe distal phalanx. Electronically Signed   By: Kennith Center M.D.   On: 11/03/2022 13:19     Procedures Procedures    Medications Ordered in ED Medications - No data to display  ED Course/ Medical Decision Making/ A&P                                 Medical Decision Making 27 year old female here with left foot pain.  She has a tiny avulsion fracture on the foot x-ray.  I personally visualized and interpreted this and agree with radiologic interpretation.  She also is more tender over the region.  Patient will be given a cam walker as she is on her feet, pregnant and has significant swelling in the toe.  Patient also got a urine here which shows no evidence of protein.  I think she is appropriate for discharge at this time with outpatient follow-up.  Discussed return precautions.  Amount and/or Complexity of Data Reviewed Labs: ordered. Radiology: ordered.           Final Clinical Impression(s) / ED Diagnoses Final diagnoses:  Displaced fracture of distal phalanx of left great toe, initial encounter for closed fracture    Rx / DC Orders ED Discharge Orders     None         Arthor Captain, PA-C 11/03/22 1843    Alvira Monday, MD 11/13/22 1506

## 2022-11-03 NOTE — ED Triage Notes (Signed)
Patient slipped on the steps hurt her left great toe. She denied falling down the steps or hitting her stomach. She stated she has been feeling the baby move like normal, no bleeding or discharge or pain in her ABD.

## 2022-11-10 ENCOUNTER — Other Ambulatory Visit: Payer: Self-pay

## 2022-11-10 ENCOUNTER — Encounter (HOSPITAL_BASED_OUTPATIENT_CLINIC_OR_DEPARTMENT_OTHER): Payer: Self-pay | Admitting: *Deleted

## 2022-11-10 ENCOUNTER — Emergency Department (HOSPITAL_BASED_OUTPATIENT_CLINIC_OR_DEPARTMENT_OTHER)
Admission: EM | Admit: 2022-11-10 | Discharge: 2022-11-10 | Discharge status: Against Medical Advice | Payer: Medicaid Other | Attending: Emergency Medicine | Admitting: Emergency Medicine

## 2022-11-10 DIAGNOSIS — Z5321 Procedure and treatment not carried out due to patient leaving prior to being seen by health care provider: Secondary | ICD-10-CM | POA: Diagnosis not present

## 2022-11-10 DIAGNOSIS — M545 Low back pain, unspecified: Secondary | ICD-10-CM | POA: Insufficient documentation

## 2022-11-10 DIAGNOSIS — N939 Abnormal uterine and vaginal bleeding, unspecified: Secondary | ICD-10-CM | POA: Diagnosis not present

## 2022-11-10 LAB — URINALYSIS, MICROSCOPIC (REFLEX)

## 2022-11-10 LAB — COMPREHENSIVE METABOLIC PANEL
ALT: 22 U/L (ref 0–44)
AST: 17 U/L (ref 15–41)
Albumin: 3.4 g/dL — ABNORMAL LOW (ref 3.5–5.0)
Alkaline Phosphatase: 58 U/L (ref 38–126)
Anion gap: 9 (ref 5–15)
BUN: 9 mg/dL (ref 6–20)
CO2: 22 mmol/L (ref 22–32)
Calcium: 8.8 mg/dL — ABNORMAL LOW (ref 8.9–10.3)
Chloride: 101 mmol/L (ref 98–111)
Creatinine, Ser: 0.78 mg/dL (ref 0.44–1.00)
GFR, Estimated: >60 mL/min (ref >60)
Glucose, Bld: 89 mg/dL (ref 70–99)
Potassium: 3.7 mmol/L (ref 3.5–5.1)
Sodium: 132 mmol/L — ABNORMAL LOW (ref 135–145)
Total Bilirubin: 0.4 mg/dL (ref 0.3–1.2)
Total Protein: 6.9 g/dL (ref 6.5–8.1)

## 2022-11-10 LAB — CBC WITH DIFFERENTIAL/PLATELET
Abs Immature Granulocytes: 0.05 10*3/uL (ref 0.00–0.07)
Basophils Absolute: 0 10*3/uL (ref 0.0–0.1)
Basophils Relative: 0 %
Eosinophils Absolute: 0.1 10*3/uL (ref 0.0–0.5)
Eosinophils Relative: 1 %
HCT: 34 % — ABNORMAL LOW (ref 36.0–46.0)
Hemoglobin: 11.4 g/dL — ABNORMAL LOW (ref 12.0–15.0)
Immature Granulocytes: 1 %
Lymphocytes Relative: 22 %
Lymphs Abs: 2.1 10*3/uL (ref 0.7–4.0)
MCH: 27 pg (ref 26.0–34.0)
MCHC: 33.5 g/dL (ref 30.0–36.0)
MCV: 80.6 fL (ref 80.0–100.0)
Monocytes Absolute: 0.6 10*3/uL (ref 0.1–1.0)
Monocytes Relative: 7 %
Neutro Abs: 6.5 10*3/uL (ref 1.7–7.7)
Neutrophils Relative %: 69 %
Platelets: 256 10*3/uL (ref 150–400)
RBC: 4.22 MIL/uL (ref 3.87–5.11)
RDW: 14.1 % (ref 11.5–15.5)
WBC: 9.4 10*3/uL (ref 4.0–10.5)
nRBC: 0 % (ref 0.0–0.2)

## 2022-11-10 LAB — URINALYSIS, ROUTINE W REFLEX MICROSCOPIC
Bilirubin Urine: NEGATIVE
Glucose, UA: NEGATIVE mg/dL
Ketones, ur: NEGATIVE mg/dL
Leukocytes,Ua: NEGATIVE
Nitrite: NEGATIVE
Protein, ur: NEGATIVE mg/dL
Specific Gravity, Urine: 1.025 (ref 1.005–1.030)
pH: 7 (ref 5.0–8.0)

## 2022-11-10 NOTE — ED Triage Notes (Addendum)
Here by POV from home s/p vaginal bleeding, onset 15 minutes PTA. Mentions fall down steps 8/3, seen here for the same at that time. Dark, spotting, no clots. G2P1. OBGYN is [facility]. Denies pain, sob, NVD, fever, dizziness. Mentions some back mid low back cramping. Alert, NAD, calm, interactive, steady gait. Last OB visit ~ 2 weeks, checked out as "OK". High risk for HTN. Last intercourse 4-5d ago.   Now mentions some dysuria.   "Feeling baby movement"

## 2022-12-24 ENCOUNTER — Encounter (HOSPITAL_BASED_OUTPATIENT_CLINIC_OR_DEPARTMENT_OTHER): Payer: Self-pay | Admitting: Pediatrics

## 2022-12-24 ENCOUNTER — Other Ambulatory Visit: Payer: Self-pay

## 2022-12-24 ENCOUNTER — Emergency Department (HOSPITAL_BASED_OUTPATIENT_CLINIC_OR_DEPARTMENT_OTHER)
Admission: EM | Admit: 2022-12-24 | Discharge: 2022-12-24 | Disposition: A | Discharge status: Home / Self Care | Payer: Medicaid Other | Attending: Emergency Medicine | Admitting: Emergency Medicine

## 2022-12-24 DIAGNOSIS — O99512 Diseases of the respiratory system complicating pregnancy, second trimester: Secondary | ICD-10-CM | POA: Insufficient documentation

## 2022-12-24 DIAGNOSIS — J069 Acute upper respiratory infection, unspecified: Secondary | ICD-10-CM | POA: Diagnosis not present

## 2022-12-24 DIAGNOSIS — Z1152 Encounter for screening for COVID-19: Secondary | ICD-10-CM | POA: Insufficient documentation

## 2022-12-24 LAB — RESP PANEL BY RT-PCR (RSV, FLU A&B, COVID)  RVPGX2
Influenza A by PCR: NEGATIVE
Influenza B by PCR: NEGATIVE
Resp Syncytial Virus by PCR: NEGATIVE
SARS Coronavirus 2 by RT PCR: NEGATIVE

## 2022-12-24 MED ORDER — AEROCHAMBER PLUS FLO-VU MEDIUM MISC
1.0000 | Freq: Once | Status: AC
  Administered 2022-12-24: 1
  Filled 2022-12-24: qty 1

## 2022-12-24 MED ORDER — ALBUTEROL SULFATE HFA 108 (90 BASE) MCG/ACT IN AERS
2.0000 | INHALATION_SPRAY | Freq: Four times a day (QID) | RESPIRATORY_TRACT | Status: DC
  Administered 2022-12-24: 2 via RESPIRATORY_TRACT
  Filled 2022-12-24: qty 6.7

## 2022-12-24 NOTE — ED Provider Notes (Signed)
[place] [facility] Provider Note   CSN: [id number] Arrival date & time: 12/24/22  1455     History  Chief Complaint  Patient presents with   Cough    Traci Carney is a 27 y.o. female who is [redacted] weeks pregnant (G2, P1) presents to the ED for coughing.  1 to 2 weeks of dry coughing that has been persistent since the onset.  Feels similar to prior episodes of bronchitis.  No fevers, chills, productive sputum, chest pain or lower extremity swelling.  Denies vaginal bleeding and abdominal pain.  Patient notes her mother recently recovered from COVID and tested +2 weeks ago.  She had an OB appointment earlier today where an ultrasound was taken and showed a viable pregnancy at 27 weeks with no significant abnormalities.   Cough      Home Medications Prior to Admission medications   Medication Sig Start Date End Date Taking? Authorizing Provider  chlorpheniramine-HYDROcodone (TUSSIONEX) 10-8 MG/5ML Take 5 mLs by mouth every 12 (twelve) hours as needed for cough. 07/25/22   Aviva Signs, CNM  guaiFENesin (MUCINEX) 600 MG 12 hr tablet Take 1 tablet (600 mg total) by mouth 2 (two) times daily as needed. 07/25/22   Aviva Signs, CNM  Prenatal Vit-Fe Fumarate-FA (PRENATAL COMPLETE) 14-0.4 MG TABS Take 1 tablet by mouth daily after breakfast. 11/27/17   Judeth Horn, NP  promethazine (PHENERGAN) 12.5 MG tablet Take 1 tablet (12.5 mg total) by mouth every 6 (six) hours as needed for nausea or vomiting. 07/11/19   Marylene Land, CNM      Allergies    Patient has no known allergies.    Review of Systems   Review of Systems  Respiratory:  Positive for cough.     Physical Exam Updated Vital Signs BP 130/73 (BP Location: Left Arm)   Pulse 86   Temp 98.2 F (36.8 C) (Oral)   Resp 16   Ht 5\' 6"  (1.676 m)   Wt 93.4 kg   LMP 06/16/2022   SpO2 99%   BMI 33.25 kg/m  Physical Exam Vitals and nursing note reviewed.  HENT:      Head: Normocephalic and atraumatic.  Eyes:     Pupils: Pupils are equal, round, and reactive to light.  Cardiovascular:     Rate and Rhythm: Normal rate and regular rhythm.  Pulmonary:     Effort: Pulmonary effort is normal.     Breath sounds: Normal breath sounds.  Abdominal:     Tenderness: There is no abdominal tenderness.     Comments: Gravid abdomen  Skin:    General: Skin is warm and dry.  Neurological:     Mental Status: She is alert.  Psychiatric:        Mood and Affect: Mood normal.     ED Results / Procedures / Treatments   Labs (all labs ordered are listed, but only abnormal results are displayed) Labs Reviewed  RESP PANEL BY RT-PCR (RSV, FLU A&B, COVID)  [id number]    EKG None  Radiology No results found.  Procedures Procedures    Medications Ordered in ED Medications  albuterol (VENTOLIN HFA) 108 (90 Base) MCG/ACT inhaler 2 puff (has no administration in time range)  AeroChamber Plus Flo-Vu Medium MISC 1 each (has no administration in time range)    ED Course/ Medical Decision Making/ A&P Clinical Course as of 12/24/22 1629  Tue Dec 24, 2022  1626 COVID/3 RSV/influenza negative.  Will  provide patient with albuterol inhaler and spacer instructed how to use this for symptomatic relief of likely viral bronchitis.  No indications for antibiotics at this time.  She will follow-up with her OB and PCP.  She understands return precautions that we worrisome for bacterial pneumonia and other causes of shortness of breath including PE.  Stable for discharge at this time [MP]    Clinical Course User Index [MP] Royanne Foots, DO                                 Medical Decision Making 27 year old female presenting in her 27-week of pregnancy for ongoing dry cough for last week.  No chest pain at rest.  No lower extremity swelling.  She does have a history of bronchitis.  Suspect this is most likely residual viral respiratory infection especially with recent  COVID-positive contacts.  Lower suspicion for venous thromboembolism considering she is not tachycardic or having chest pain.  Lower sufficient for bacterial pneumonia given lack of fevers and productive coughing.  Will obtain COVID/influenza/RSV swab here in the ED.  Risk Prescription drug management.           Final Clinical Impression(s) / ED Diagnoses Final diagnoses:  Viral URI with cough    Rx / DC Orders ED Discharge Orders     None         Royanne Foots, DO 12/24/22 1629

## 2022-12-24 NOTE — Discharge Instructions (Signed)
You were seen in the emergency department for coughing Your COVID/RSV/influenza test were negative The most likely cause of your dry cough is a viral respiratory illness which you are recovering from As discussed, he may use albuterol as directed with a spacer to help with coughing Do not use more than directed Return to the emerged part for trouble breathing, productive coughing, chest pain or any other concerns Otherwise please follow-up with your OB/GYN and PCP within the next week for reevaluation

## 2022-12-24 NOTE — ED Notes (Addendum)
Pt staes she just saw her OB dr today and everything was fine with baby states told her dr about her cough sob and pain in her ribs staes was told to come to ER states did not offer Covid test, staes her mom had  covid 2 weeks ago

## 2022-12-24 NOTE — ED Triage Notes (Signed)
Reports [redacted] weeks pregnant, G2P1. C/O productive cough for over a week.

## 2023-06-30 ENCOUNTER — Other Ambulatory Visit: Payer: Self-pay

## 2023-06-30 ENCOUNTER — Encounter (HOSPITAL_BASED_OUTPATIENT_CLINIC_OR_DEPARTMENT_OTHER): Payer: Self-pay | Admitting: Emergency Medicine

## 2023-06-30 ENCOUNTER — Emergency Department (HOSPITAL_BASED_OUTPATIENT_CLINIC_OR_DEPARTMENT_OTHER)
Admission: EM | Admit: 2023-06-30 | Discharge: 2023-06-30 | Disposition: A | Discharge status: Home / Self Care | Attending: Emergency Medicine | Admitting: Emergency Medicine

## 2023-06-30 DIAGNOSIS — R110 Nausea: Secondary | ICD-10-CM | POA: Diagnosis not present

## 2023-06-30 DIAGNOSIS — R109 Unspecified abdominal pain: Secondary | ICD-10-CM

## 2023-06-30 DIAGNOSIS — R1013 Epigastric pain: Secondary | ICD-10-CM | POA: Diagnosis present

## 2023-06-30 LAB — COMPREHENSIVE METABOLIC PANEL WITH GFR
ALT: 18 U/L (ref 0–44)
AST: 19 U/L (ref 15–41)
Albumin: 3.6 g/dL (ref 3.5–5.0)
Alkaline Phosphatase: 64 U/L (ref 38–126)
Anion gap: 9 (ref 5–15)
BUN: 12 mg/dL (ref 6–20)
CO2: 24 mmol/L (ref 22–32)
Calcium: 8.8 mg/dL — ABNORMAL LOW (ref 8.9–10.3)
Chloride: 104 mmol/L (ref 98–111)
Creatinine, Ser: 0.87 mg/dL (ref 0.44–1.00)
GFR, Estimated: >60 mL/min (ref >60)
Glucose, Bld: 89 mg/dL (ref 70–99)
Potassium: 3.7 mmol/L (ref 3.5–5.1)
Sodium: 137 mmol/L (ref 135–145)
Total Bilirubin: 0.5 mg/dL (ref 0.0–1.2)
Total Protein: 6.8 g/dL (ref 6.5–8.1)

## 2023-06-30 LAB — PREGNANCY, URINE: Preg Test, Ur: NEGATIVE

## 2023-06-30 LAB — CBC WITH DIFFERENTIAL/PLATELET
Abs Immature Granulocytes: 0.03 10*3/uL (ref 0.00–0.07)
Basophils Absolute: 0 10*3/uL (ref 0.0–0.1)
Basophils Relative: 0 %
Eosinophils Absolute: 0.1 10*3/uL (ref 0.0–0.5)
Eosinophils Relative: 1 %
HCT: 38.1 % (ref 36.0–46.0)
Hemoglobin: 12.6 g/dL (ref 12.0–15.0)
Immature Granulocytes: 1 %
Lymphocytes Relative: 36 %
Lymphs Abs: 2.4 10*3/uL (ref 0.7–4.0)
MCH: 26 pg (ref 26.0–34.0)
MCHC: 33.1 g/dL (ref 30.0–36.0)
MCV: 78.6 fL — ABNORMAL LOW (ref 80.0–100.0)
Monocytes Absolute: 0.4 10*3/uL (ref 0.1–1.0)
Monocytes Relative: 7 %
Neutro Abs: 3.7 10*3/uL (ref 1.7–7.7)
Neutrophils Relative %: 55 %
Platelets: 225 10*3/uL (ref 150–400)
RBC: 4.85 MIL/uL (ref 3.87–5.11)
RDW: 14.5 % (ref 11.5–15.5)
WBC: 6.6 10*3/uL (ref 4.0–10.5)
nRBC: 0 % (ref 0.0–0.2)

## 2023-06-30 LAB — URINALYSIS, ROUTINE W REFLEX MICROSCOPIC
Bilirubin Urine: NEGATIVE
Glucose, UA: NEGATIVE mg/dL
Hgb urine dipstick: NEGATIVE
Ketones, ur: NEGATIVE mg/dL
Nitrite: NEGATIVE
Protein, ur: NEGATIVE mg/dL
Specific Gravity, Urine: 1.02 (ref 1.005–1.030)
pH: 7 (ref 5.0–8.0)

## 2023-06-30 LAB — URINALYSIS, MICROSCOPIC (REFLEX)

## 2023-06-30 LAB — LIPASE, BLOOD: Lipase: 43 U/L (ref 11–51)

## 2023-06-30 MED ORDER — ONDANSETRON 8 MG PO TBDP: 8.0000 mg | ORAL_TABLET | Freq: Three times a day (TID) | ORAL | 0 refills | Status: AC | PRN

## 2023-06-30 MED ORDER — NAPROXEN 375 MG PO TABS: 375.0000 mg | ORAL_TABLET | Freq: Two times a day (BID) | ORAL | 0 refills | Status: AC

## 2023-06-30 MED ORDER — ONDANSETRON HCL 4 MG/2ML IJ SOLN
4.0000 mg | Freq: Once | INTRAMUSCULAR | Status: AC
  Administered 2023-06-30: 4 mg via INTRAVENOUS
  Filled 2023-06-30: qty 2

## 2023-06-30 MED ORDER — KETOROLAC TROMETHAMINE 15 MG/ML IJ SOLN
15.0000 mg | Freq: Once | INTRAMUSCULAR | Status: AC
  Administered 2023-06-30: 15 mg via INTRAVENOUS
  Filled 2023-06-30: qty 1

## 2023-06-30 MED ORDER — POLYETHYLENE GLYCOL 3350 17 G PO PACK: 17.0000 g | PACK | Freq: Every day | ORAL | 0 refills | Status: AC

## 2023-06-30 MED ORDER — SODIUM CHLORIDE 0.9 % IV BOLUS
1000.0000 mL | Freq: Once | INTRAVENOUS | Status: AC
  Administered 2023-06-30: 1000 mL via INTRAVENOUS

## 2023-06-30 NOTE — ED Notes (Signed)
 IV REMOVED

## 2023-06-30 NOTE — Discharge Instructions (Signed)
 The test today in the emergency room were reassuring.  Take the medications to help with pain and nausea.  The MiraLAX is a medication for constipation.  Return to the ER for recurrent symptoms fevers vomiting or other concerning symptoms

## 2023-06-30 NOTE — ED Provider Notes (Signed)
 [place] [facility] AT [facility] Provider Note   CSN: [id number] Arrival date & time: 06/30/23  5427     History  Chief Complaint  Patient presents with   Back Pain   Abdominal Pain    Traci Carney is a 28 y.o. female.   Back Pain Associated symptoms: abdominal pain   Abdominal Pain    Patient presents emergency room with complaints of abdominal pain.  Patient states she was having some complaints of vaginal odor last week.  She went to see her OB/GYN doctor on March 24.  She was started on Flagyl.  Patient has noticed in the last few days she has been having pain in her abdomen.  She has had nausea.  No vomiting or diarrhea no dysuria.  The patient states the pain has been mostly in her upper abdomen.  It does go to her back.  She had an episode last night where is more intense.  She felt like she needed to go to the bathroom but did not feel any better afterwards.  Patient has had a prior cholecystectomy.  Patient denies any alcohol use recently.  Home Medications Prior to Admission medications   Medication Sig Start Date End Date Taking? Authorizing Provider  naproxen (NAPROSYN) 375 MG tablet Take 1 tablet (375 mg total) by mouth 2 (two) times daily. 06/30/23  Yes Linwood Dibbles, MD  ondansetron (ZOFRAN-ODT) 8 MG disintegrating tablet Take 1 tablet (8 mg total) by mouth every 8 (eight) hours as needed for nausea or vomiting. 06/30/23  Yes Linwood Dibbles, MD  polyethylene glycol (MIRALAX) 17 g packet Take 17 g by mouth daily. 06/30/23  Yes Linwood Dibbles, MD  chlorpheniramine-HYDROcodone (TUSSIONEX) 10-8 MG/5ML Take 5 mLs by mouth every 12 (twelve) hours as needed for cough. 07/25/22   Aviva Signs, CNM  guaiFENesin (MUCINEX) 600 MG 12 hr tablet Take 1 tablet (600 mg total) by mouth 2 (two) times daily as needed. 07/25/22   Aviva Signs, CNM  Prenatal Vit-Fe Fumarate-FA (PRENATAL COMPLETE) 14-0.4 MG TABS Take 1 tablet by mouth daily after breakfast. 11/27/17    Judeth Horn, NP  promethazine (PHENERGAN) 12.5 MG tablet Take 1 tablet (12.5 mg total) by mouth every 6 (six) hours as needed for nausea or vomiting. 07/11/19   Marylene Land, CNM      Allergies    Patient has no known allergies.    Review of Systems   Review of Systems  Gastrointestinal:  Positive for abdominal pain.  Musculoskeletal:  Positive for back pain.    Physical Exam Updated Vital Signs BP 132/87   Pulse 79   Temp 97.6 F (36.4 C) (Oral)   Resp 18   Ht 1.676 m (5\' 6" )   Wt 88.5 kg   SpO2 100%   Breastfeeding No   BMI 31.47 kg/m  Physical Exam Vitals and nursing note reviewed.  Constitutional:      General: She is not in acute distress.    Appearance: She is well-developed.  HENT:     Head: Normocephalic and atraumatic.     Right Ear: External ear normal.     Left Ear: External ear normal.  Eyes:     General: No scleral icterus.       Right eye: No discharge.        Left eye: No discharge.     Conjunctiva/sclera: Conjunctivae normal.  Neck:     Trachea: No tracheal deviation.  Cardiovascular:  Rate and Rhythm: Normal rate and regular rhythm.  Pulmonary:     Effort: Pulmonary effort is normal. No respiratory distress.     Breath sounds: Normal breath sounds. No stridor. No wheezing or rales.  Abdominal:     General: Bowel sounds are normal. There is no distension.     Palpations: Abdomen is soft.     Tenderness: There is abdominal tenderness in the epigastric area. There is no guarding or rebound.  Musculoskeletal:        General: No tenderness or deformity.     Cervical back: Neck supple.  Skin:    General: Skin is warm and dry.     Findings: No rash.  Neurological:     General: No focal deficit present.     Mental Status: She is alert.     Cranial Nerves: No cranial nerve deficit, dysarthria or facial asymmetry.     Sensory: No sensory deficit.     Motor: No abnormal muscle tone or seizure activity.     Coordination:  Coordination normal.  Psychiatric:        Mood and Affect: Mood normal.     ED Results / Procedures / Treatments   Labs (all labs ordered are listed, but only abnormal results are displayed) Labs Reviewed  URINALYSIS, ROUTINE W REFLEX MICROSCOPIC - Abnormal; Notable for the following components:      Result Value   Leukocytes,Ua TRACE (*)    All other components within normal limits  URINALYSIS, MICROSCOPIC (REFLEX) - Abnormal; Notable for the following components:   Bacteria, UA FEW (*)    All other components within normal limits  COMPREHENSIVE METABOLIC PANEL WITH GFR - Abnormal; Notable for the following components:   Calcium 8.8 (*)    All other components within normal limits  CBC WITH DIFFERENTIAL/PLATELET - Abnormal; Notable for the following components:   MCV 78.6 (*)    All other components within normal limits  PREGNANCY, URINE  LIPASE, BLOOD    EKG None  Radiology No results found.  Procedures Procedures    Medications Ordered in ED Medications  sodium chloride 0.9 % bolus 1,000 mL (1,000 mLs Intravenous New Bag/Given 06/30/23 0730)  ondansetron (ZOFRAN) injection 4 mg (4 mg Intravenous Given 06/30/23 0731)  ketorolac (TORADOL) 15 MG/ML injection 15 mg (15 mg Intravenous Given 06/30/23 0731)    ED Course/ Medical Decision Making/ A&P Clinical Course as of 06/30/23 0825  Mon Jun 30, 2023  0759 CBC with Diff(!) CBC normal metabolic panel normal.  Lipase normal.  Urinalysis not suggestive of infection.  Pregnancy test negative [JK]    Clinical Course User Index [JK] Linwood Dibbles, MD                                 Medical Decision Making Differential diagnosis includes but not limited to hepatitis pancreatitis choledocholithiasis, medication adverse effect  Problems Addressed: Abdominal pain, unspecified abdominal location: acute illness or injury that poses a threat to life or bodily functions  Amount and/or Complexity of Data Reviewed Labs: ordered.  Decision-making details documented in ED Course.  Risk OTC drugs. Prescription drug management.   Patient presented to ED with complaints of upper abdominal discomfort.  Patient has no focal areas of guarding or rebound.  Her ED workup was reassuring.  CBC was normal.  Metabolic panel was normal.  No evidence of hepatitis or pancreatitis.  Urinalysis does not suggest infection.  No  hematuria to suggest renal colic.  Patient felt better after dose of Toradol and Zofran.  She has been having some issues with constipation since the birth of her child in December.  She is not sure if that could be a related issue.  Low suspicion at this time for colitis diverticulitis appendicitis.  Discussed options of doing a CT scan in the emergency room today versus supportive care and return to the ED for recurrent symptoms.  Patient is comfortable with not doing a CT scan at this time.  I think this is reasonable.  Will give a prescription for MiraLAX Naprosyn and Zofran.        Final Clinical Impression(s) / ED Diagnoses Final diagnoses:  Abdominal pain, unspecified abdominal location    Rx / DC Orders ED Discharge Orders          Ordered    naproxen (NAPROSYN) 375 MG tablet  2 times daily        06/30/23 0824    ondansetron (ZOFRAN-ODT) 8 MG disintegrating tablet  Every 8 hours PRN        06/30/23 0824    polyethylene glycol (MIRALAX) 17 g packet  Daily        06/30/23 0824              Linwood Dibbles, MD 06/30/23 [phone]

## 2023-06-30 NOTE — ED Triage Notes (Addendum)
 Pt reports severe lower back pain and associated nausea since yesterday. She states that around 0200 her abdomen started hurting as well. Denies fever. She was recently put on Flagyl for a concern for BV (odor, burning with urination) despite a negative swab. Pt pinpoints pain to BIL flanks and lower abd. Rates pain 7/10 and describes it as intermittent cramping.

## 2023-10-31 ENCOUNTER — Emergency Department (HOSPITAL_BASED_OUTPATIENT_CLINIC_OR_DEPARTMENT_OTHER)
Admission: EM | Admit: 2023-10-31 | Discharge: 2023-10-31 | Disposition: A | Discharge status: Home / Self Care | Attending: Emergency Medicine | Admitting: Emergency Medicine

## 2023-10-31 ENCOUNTER — Encounter (HOSPITAL_BASED_OUTPATIENT_CLINIC_OR_DEPARTMENT_OTHER): Payer: Self-pay | Admitting: Emergency Medicine

## 2023-10-31 ENCOUNTER — Other Ambulatory Visit: Payer: Self-pay

## 2023-10-31 ENCOUNTER — Emergency Department (HOSPITAL_BASED_OUTPATIENT_CLINIC_OR_DEPARTMENT_OTHER)

## 2023-10-31 ENCOUNTER — Other Ambulatory Visit (HOSPITAL_BASED_OUTPATIENT_CLINIC_OR_DEPARTMENT_OTHER): Payer: Self-pay

## 2023-10-31 DIAGNOSIS — R059 Cough, unspecified: Secondary | ICD-10-CM | POA: Insufficient documentation

## 2023-10-31 DIAGNOSIS — R051 Acute cough: Secondary | ICD-10-CM

## 2023-10-31 DIAGNOSIS — R062 Wheezing: Secondary | ICD-10-CM | POA: Diagnosis not present

## 2023-10-31 LAB — RESP PANEL BY RT-PCR (RSV, FLU A&B, COVID)  RVPGX2
Influenza A by PCR: NEGATIVE
Influenza B by PCR: NEGATIVE
Resp Syncytial Virus by PCR: NEGATIVE
SARS Coronavirus 2 by RT PCR: NEGATIVE

## 2023-10-31 MED ORDER — PREDNISONE 50 MG PO TABS
50.0000 mg | ORAL_TABLET | Freq: Every day | ORAL | 0 refills | Status: AC
  Filled 2023-10-31: qty 5, 5d supply, fill #0

## 2023-10-31 MED ORDER — ALBUTEROL SULFATE HFA 108 (90 BASE) MCG/ACT IN AERS
2.0000 | INHALATION_SPRAY | Freq: Once | RESPIRATORY_TRACT | Status: AC
  Administered 2023-10-31: 2 via RESPIRATORY_TRACT
  Filled 2023-10-31: qty 6.7

## 2023-10-31 NOTE — ED Notes (Signed)
 Pt alert and oriented X 4 at the time of discharge. RR even and unlabored. No acute distress noted. Pt verbalized understanding of discharge instructions as discussed. Pt ambulatory to lobby at time of discharge.

## 2023-10-31 NOTE — ED Triage Notes (Signed)
 Productive cough x 2 weeks.  Some green mucous initially, now yellow.  Pt endorses headache as well.  No known fevers.  Pt clears throat a lot.

## 2023-10-31 NOTE — ED Provider Notes (Signed)
 Traci Carney Provider Note   CSN: [id number] Arrival date & time: 10/31/23  0825     Patient presents with: Cough   Traci Carney is a 28 y.o. female.   The history is provided by the patient and medical records. No language interpreter was used.  Cough Cough characteristics:  Productive Sputum characteristics:  Clear Severity:  Moderate Onset quality:  Gradual Duration:  2 weeks Timing:  Constant Progression:  Waxing and waning Chronicity:  Recurrent Relieved by:  Nothing Worsened by:  Nothing Ineffective treatments:  None tried Associated symptoms: shortness of breath (mild) and wheezing (mild)   Associated symptoms: no chest pain, no chills, no diaphoresis, no fever, no headaches, no rash, no rhinorrhea and no sinus congestion   Risk factors: recent infection (recent uri)        Prior to Admission medications   Medication Sig Start Date End Date Taking? Authorizing Provider  chlorpheniramine-HYDROcodone (TUSSIONEX) 10-8 MG/5ML Take 5 mLs by mouth every 12 (twelve) hours as needed for cough. 07/25/22   Trudy Earnie CROME, CNM  guaiFENesin  (MUCINEX ) 600 MG 12 hr tablet Take 1 tablet (600 mg total) by mouth 2 (two) times daily as needed. 07/25/22   Trudy Earnie CROME, CNM  naproxen  (NAPROSYN ) 375 MG tablet Take 1 tablet (375 mg total) by mouth 2 (two) times daily. 06/30/23   Randol Simmonds, MD  ondansetron  (ZOFRAN -ODT) 8 MG disintegrating tablet Take 1 tablet (8 mg total) by mouth every 8 (eight) hours as needed for nausea or vomiting. 06/30/23   Randol Simmonds, MD  polyethylene glycol (MIRALAX ) 17 g packet Take 17 g by mouth daily. 06/30/23   Randol Simmonds, MD  Prenatal Vit-Fe Fumarate-FA (PRENATAL COMPLETE) 14-0.4 MG TABS Take 1 tablet by mouth daily after breakfast. 11/27/17   Jerilynn Longs, NP  promethazine  (PHENERGAN ) 12.5 MG tablet Take 1 tablet (12.5 mg total) by mouth every 6 (six) hours as needed for nausea or vomiting. 07/11/19    Kooistra, Kathryn Lorraine, CNM    Allergies: Patient has no known allergies.    Review of Systems  Constitutional:  Negative for chills, diaphoresis, fatigue and fever.  HENT:  Negative for congestion and rhinorrhea.   Eyes:  Negative for visual disturbance.  Respiratory:  Positive for cough, chest tightness, shortness of breath (mild) and wheezing (mild).   Cardiovascular:  Negative for chest pain.  Gastrointestinal:  Negative for abdominal pain.  Genitourinary:  Negative for dysuria.  Musculoskeletal:  Negative for back pain and neck pain.  Skin:  Negative for rash.  Neurological:  Negative for weakness, light-headedness, numbness and headaches.  Psychiatric/Behavioral:  Negative for agitation and confusion.   All other systems reviewed and are negative.   Updated Vital Signs BP 118/77   Pulse 70   Temp 98.8 F (37.1 C) (Oral)   Resp 18   Ht 5' 6 (1.676 m)   Wt 85.3 kg   SpO2 100%   BMI 30.34 kg/m   Physical Exam Vitals and nursing note reviewed.  Constitutional:      General: She is not in acute distress.    Appearance: She is well-developed. She is not ill-appearing, toxic-appearing or diaphoretic.  HENT:     Head: Normocephalic and atraumatic.     Nose: No congestion or rhinorrhea.     Mouth/Throat:     Mouth: Mucous membranes are moist.  Eyes:     Extraocular Movements: Extraocular movements intact.     Conjunctiva/sclera: Conjunctivae normal.  Pupils: Pupils are equal, round, and reactive to light.  Cardiovascular:     Rate and Rhythm: Normal rate and regular rhythm.     Heart sounds: No murmur heard. Pulmonary:     Effort: Pulmonary effort is normal. No respiratory distress.     Breath sounds: Wheezing present. No rhonchi or rales.  Chest:     Chest wall: No tenderness.  Abdominal:     General: Abdomen is flat.     Palpations: Abdomen is soft.     Tenderness: There is no abdominal tenderness.  Musculoskeletal:        General: No swelling or  tenderness.     Cervical back: Neck supple.     Right lower leg: No edema.     Left lower leg: No edema.  Skin:    General: Skin is warm and dry.     Capillary Refill: Capillary refill takes less than 2 seconds.     Findings: No erythema or rash.  Neurological:     General: No focal deficit present.     Mental Status: She is alert.  Psychiatric:        Mood and Affect: Mood normal.     (all labs ordered are listed, but only abnormal results are displayed) Labs Reviewed  RESP PANEL BY RT-PCR (RSV, FLU A&B, COVID)  [id number]    EKG: None  Radiology: DG Chest 2 View Result Date: 10/31/2023 CLINICAL DATA:  Two week history of productive cough EXAM: CHEST - 2 VIEW COMPARISON:  Chest radiograph dated 08/05/2018 FINDINGS: Normal lung volumes. No focal consolidations. No pleural effusion or pneumothorax. The heart size and mediastinal contours are within normal limits. No acute osseous abnormality. IMPRESSION: No active cardiopulmonary disease. Electronically Signed   By: Limin  Xu M.D.   On: 10/31/2023 09:34     Procedures   Medications Ordered in the ED  albuterol  (VENTOLIN  HFA) 108 (90 Base) MCG/ACT inhaler 2 puff (2 puffs Inhalation Given 10/31/23 0909)                                    Medical Decision Making Amount and/or Complexity of Data Reviewed Radiology: ordered.  Risk Prescription drug management.    Traci Carney is a 28 y.o. female with no significant past medical history who presents with 2 weeks of cough and some wheezing.  Patient says that in the past she has had episodes of bronchitis and coughing fits that did not improve.  She reports that previously she was given albuterol  and that seemed to help but is out of it.  She denies any history of diagnosed asthma or COPD and does not smoke.  Patient reports that she has had some cough for the last 2 weeks and other people had cough that resolved.  She reports hers initially had some green and yellow sputum but  is now just clear.  She reports she still is having difficulty with her cough.  Otherwise denies new fevers, chills, congestion.  Denies chest pain nausea, vomiting, constipation, or diarrhea.  No leg pain or leg swelling and history of blood clots.  On exam, she does some faint wheezing but no significant rales.  Very faint rhonchi.  Otherwise vital signs reassuring on arrival.  Patient agreed to get a viral swab and an x-ray to look for pneumonia.  X-ray and swab reassuring.  No pneumonia.  Suspect some degree of reactive airway  disease in the setting of his recent viral infection.  Patient felt much better after albuterol  and will give a burst of steroids.  She has cough medicine at home she will use.  She had other questions or concerns and agrees with plan.  Patient discharged in good condition and breathing much better.             Final diagnoses:  Acute cough  Wheezing    ED Discharge Orders          Ordered    predniSONE  (DELTASONE ) 50 MG tablet  Daily        10/31/23 1009            Clinical Impression: 1. Acute cough   2. Wheezing     Disposition: Discharge  Condition: Good  I have discussed the results, Dx and Tx plan with the pt(& family if present). He/she/they expressed understanding and agree(s) with the plan. Discharge instructions discussed at great length. Strict return precautions discussed and pt &/or family have verbalized understanding of the instructions. No further questions at time of discharge.    New Prescriptions   PREDNISONE  (DELTASONE ) 50 MG TABLET    Take 1 tablet (50 mg total) by mouth daily for 5 days.    Follow Up: [facility] [address] [place] [address] [place]  [address] [phone] Schedule an appointment as soon as possible for a visit    [facility] Emergency Department at [facility] [address] [place]   [address] [phone]         Wyatt Galvan, Lonni PARAS, MD 10/31/23 1534

## 2023-10-31 NOTE — Discharge Instructions (Signed)
 Your history, exam, and evaluation today did not reveal evidence of COVID/flu/RSV nor did it show evidence of pneumonia but I do suspect you have some bronchospasm and wheezing related to the recent viral illness that went through your family.  We feel you are safe for discharge home given your reassuring vital signs and improvement after albuterol  and do want you to take 5 days of a steroid to help with lung inflammation.  Please follow-up with a primary doctor and rest and stay hydrated.  If any symptoms change or worsen acutely, please return to the nearest emergency department.

## 2023-12-03 ENCOUNTER — Emergency Department (HOSPITAL_BASED_OUTPATIENT_CLINIC_OR_DEPARTMENT_OTHER)
Admission: EM | Admit: 2023-12-03 | Discharge: 2023-12-03 | Disposition: A | Discharge status: Home / Self Care | Attending: Emergency Medicine | Admitting: Emergency Medicine

## 2023-12-03 ENCOUNTER — Emergency Department (HOSPITAL_BASED_OUTPATIENT_CLINIC_OR_DEPARTMENT_OTHER)

## 2023-12-03 ENCOUNTER — Encounter (HOSPITAL_BASED_OUTPATIENT_CLINIC_OR_DEPARTMENT_OTHER): Payer: Self-pay

## 2023-12-03 ENCOUNTER — Other Ambulatory Visit: Payer: Self-pay

## 2023-12-03 DIAGNOSIS — M25531 Pain in right wrist: Secondary | ICD-10-CM | POA: Diagnosis present

## 2023-12-03 DIAGNOSIS — M654 Radial styloid tenosynovitis [de Quervain]: Secondary | ICD-10-CM | POA: Diagnosis not present

## 2023-12-03 NOTE — Discharge Instructions (Signed)
 You were seen for your tendinitis in the emergency department.   At home, please wear the splint we have given you except while bathing and sleeping.  You may remove it to stretch her wrist from time to time during the day.  Use Tylenol  and ibuprofen  for pain.   Check your MyChart online for the results of any tests that had not resulted by the time you left the emergency department.   Follow-up with the orthopedic doctors in 1 to 2 weeks about your symptoms.  Return immediately to the emergency department if you experience any of the following: Worsening pain, or any other concerning symptoms.    Thank you for visiting our Emergency Department. It was a pleasure taking care of you today.

## 2023-12-03 NOTE — ED Triage Notes (Signed)
 C/o right wrist pain x 2 weeks. Denies injury but said she was intoxicated prior to pain and thinks she got into a little tussle. Pain worse with movement.  Taking tylenol  without improvement.

## 2023-12-03 NOTE — ED Provider Notes (Signed)
 [place] [facility] DEPART[facility] AT [facility] Provider Note   CSN: [id number] Arrival date & time: 12/03/23  1029     Patient presents with: Wrist Pain   Traci Carney is a 28 y.o. female.   28 year old female who is right-handed who presents emergency department with right wrist pain.  Patient reports that 2 weeks ago she started noticing pain in her right wrist.  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  It goes from the base of her thumb down the radial aspect of her right arm.  However, to triage she reported that she may have been intoxicated and gotten in a tussle before the pain started.  Has been taking Tylenol  for the pain       Prior to Admission medications   Medication Sig Start Date End Date Taking? Authorizing Provider  chlorpheniramine-HYDROcodone (TUSSIONEX) 10-8 MG/5ML Take 5 mLs by mouth every 12 (twelve) hours as needed for cough. 07/25/22   Trudy Earnie CROME, CNM  guaiFENesin  (MUCINEX ) 600 MG 12 hr tablet Take 1 tablet (600 mg total) by mouth 2 (two) times daily as needed. 07/25/22   Trudy Earnie CROME, CNM  naproxen  (NAPROSYN ) 375 MG tablet Take 1 tablet (375 mg total) by mouth 2 (two) times daily. 06/30/23   Randol Simmonds, MD  ondansetron  (ZOFRAN -ODT) 8 MG disintegrating tablet Take 1 tablet (8 mg total) by mouth every 8 (eight) hours as needed for nausea or vomiting. 06/30/23   Randol Simmonds, MD  polyethylene glycol (MIRALAX ) 17 g packet Take 17 g by mouth daily. 06/30/23   Randol Simmonds, MD  Prenatal Vit-Fe Fumarate-FA (PRENATAL COMPLETE) 14-0.4 MG TABS Take 1 tablet by mouth daily after breakfast. 11/27/17   Jerilynn Longs, NP  promethazine  (PHENERGAN ) 12.5 MG tablet Take 1 tablet (12.5 mg total) by mouth every 6 (six) hours as needed for nausea or vomiting. 07/11/19   Kooistra, Kathryn Lorraine, CNM    Allergies: Patient has no known allergies.    Review of Systems  Updated Vital Signs BP  127/84 (BP Location: Right Arm)   Pulse 84   Temp 98.4 F (36.9 C) (Oral)   Resp 15   Ht 5' 6 (1.676 m)   Wt 85.3 kg   LMP 11/11/2023   SpO2 98%   BMI 30.34 kg/m   Physical Exam Musculoskeletal:     Comments: Right hand without any obvious deformities.  Right wrist without any obvious deformities.  No snuffbox tenderness to palpation.  No significant tenderness palpation of the distal radius or ulna or metacarpals or carpals.  Positive Finkelstein test on the right.  Intact sensation to light touch in median, radial, and ulnar distribution.  Radial pulse 2+.  Capillary refill less than 2 seconds in all digits of the right hand     (all labs ordered are listed, but only abnormal results are displayed) Labs Reviewed - No data to display  EKG: None  Radiology: DG Wrist Complete Right Result Date: 12/03/2023 CLINICAL DATA:  Wrist pain for 2 weeks EXAM: RIGHT WRIST - COMPLETE 3+ VIEW COMPARISON:  None Available. FINDINGS: There is no evidence of fracture or dislocation. There is no evidence of arthropathy or other focal bone abnormality. Soft tissues are unremarkable. IMPRESSION: 1. No significant radiographic abnormality. If pain persists despite conservative therapy, MRI may be warranted for further characterization. Electronically Signed   By: Ryan Salvage M.D.   On: 12/03/2023 11:40  Procedures   Medications Ordered in the ED - No data to display                                  Medical Decision Making Amount and/or Complexity of Data Reviewed Radiology: ordered.   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  To me did not report any trauma.  To triage says that she may have  gotten in a tussle.  She does not have any snuffbox tenderness on exam and does have a positive Finkelstein sign.  Neurovascular intact otherwise.  X-ray without fracture.  Suspect that she may have dequervain tenosynovitis or sprain and was given a thumb spica splint and instructed to follow-up with Ortho several weeks.  Instructed to take Tylenol  and ibuprofen  for pain. Given a work note for light duty as well.   This patient presents to the ED for concern of complaints listed in HPI, this involves an extensive number of treatment options, and is a complaint that carries with it a high risk of complications and morbidity. Disposition including potential need for admission considered.   Dispo: DC Home. Return precautions discussed including, but not limited to, those listed in the AVS. Allowed pt time to ask questions which were answered fully prior to dc.  I independently reviewed the following imaging with scope of interpretation limited to determining acute life threatening conditions related to emergency care: Extremity x-ray(s) and agree with the radiologist interpretation with the following exceptions: none I have reviewed the patients home medications and made adjustments as needed  Portions of this note were generated with Dragon dictation software. Dictation errors may occur despite best attempts at proofreading.     Final diagnoses:  Everitt Curt disease (radial styloid tenosynovitis)    ED Discharge Orders     None          Traci Lamar BROCKS, MD 12/03/23 1245

## 2023-12-03 NOTE — ED Notes (Signed)
 Discharge instructions reviewed with patient. Patient verbalizes understanding, no further questions at this time. Medications and follow up information provided. No acute distress noted at time of departure.

## 2024-04-21 ENCOUNTER — Encounter: Payer: Self-pay | Admitting: Orthopedic Surgery

## 2024-04-21 ENCOUNTER — Ambulatory Visit: Admitting: Orthopedic Surgery

## 2024-04-21 DIAGNOSIS — M654 Radial styloid tenosynovitis [de Quervain]: Secondary | ICD-10-CM

## 2024-04-21 NOTE — Progress Notes (Signed)
 "  Office Visit Note   Patient: Traci Carney           Date of Birth: 1995/08/06           MRN: [id number] Visit Date: 04/21/2024 Requested by: Addie Cordella Hamilton, MD [address] [place],  [place] [address] PCP: Pcp, No  Subjective: Chief Complaint  Patient presents with   Other    Right wrist/thumb pain    HPI: Traci Carney is a 29 y.o. female who presents to the office reporting right radial styloid pain.  Been going on for 3 months.  No known injury.  She is right-hand dominant.  Does not wake her from sleep at night.  Very sore and tender in the morning.  Patient states that in the morning her hand and wrist feel dead.  Does report some occasional numbness and tingling.  She has tried a brace which does help some but it is not very functional for her.  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  Not much of a pill taker and has not tried any medication.  No other significant medical problems.  Denies any neck pain..                ROS: All systems reviewed are negative as they relate to the chief complaint within the history of present illness.  Patient denies fevers or chills.  Assessment & Plan: Visit Diagnoses:  1. De Quervain's tenosynovitis, right     Plan: Impression is de Quervain's tenosynovitis in the right wrist.  Does have positive Finkelstein's testing as well as tenderness and some swelling over the radial styloid.  Will try something topical first.  Voltaren cream could be helpful twice a day.  If not then I think she should come back for an ultrasound-guided injection into that first dorsal compartment.  She will follow-up with us  as needed.  Follow-Up Instructions: No follow-ups on file.   Orders:  No orders of the defined types were placed in this encounter.  No orders of the defined types were placed in this encounter.     Procedures: No procedures performed   Clinical Data: No additional  findings.  Objective: Vital Signs: There were no vitals taken for this visit.  Physical Exam:  Constitutional: Patient appears well-developed HEENT:  Head: Normocephalic Eyes:EOM are normal Neck: Normal range of motion Cardiovascular: Normal rate Pulmonary/chest: Effort normal Neurologic: Patient is alert Skin: Skin is warm Psychiatric: Patient has normal mood and affect  Ortho Exam: Ortho exam demonstrates positive Finklestein's test on the right negative on the left.  EPL FPL interosseous strength is intact.  Patient has tenderness and some swelling over the radial styloid on the right not the left.  Negative grind test CMC arthritis bilateral.  Bilateral wrist range of motion intact.  Radial pulse intact.  Negative carpal tunnel compression testing on the right.  Specialty Comments:  No specialty comments available.  Imaging: No results found.   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 No pertinent past medical history.  Family History  Problem Relation Age of Onset   Hypertension Mother     Past Surgical History:  Procedure Laterality Date   CHOLECYSTECTOMY     DILATION AND CURETTAGE OF UTERUS  08/2018   EAB   HERNIA REPAIR     TONSILLECTOMY     Social History  Occupational History   Not on file  Tobacco Use   Smoking status: Former   Smokeless tobacco: Never  Vaping Use   Vaping status: Never Used  Substance and Sexual Activity   Alcohol use: Yes    Comment: occasionally    Drug use: No   Sexual activity: Not Currently    Birth control/protection: None    Comment: IC Tuesday 4/23        "
